# Patient Record
Sex: Male | Born: 1960 | Race: White | Hispanic: No | Marital: Single | Smoking: Former smoker
Health system: Southern US, Community
[De-identification: ages and names within clinical notes are randomized; demographics above are authoritative.]

## PROBLEM LIST (undated history)

## (undated) DIAGNOSIS — I10 Essential (primary) hypertension: Secondary | ICD-10-CM

## (undated) DIAGNOSIS — I509 Heart failure, unspecified: Secondary | ICD-10-CM

## (undated) DIAGNOSIS — I2510 Atherosclerotic heart disease of native coronary artery without angina pectoris: Secondary | ICD-10-CM

## (undated) DIAGNOSIS — I219 Acute myocardial infarction, unspecified: Secondary | ICD-10-CM

---

## 2013-01-29 DIAGNOSIS — R55 Syncope and collapse: Secondary | ICD-10-CM

## 2013-01-29 DIAGNOSIS — I214 Non-ST elevation (NSTEMI) myocardial infarction: Secondary | ICD-10-CM

## 2013-01-29 LAB — DRUG SCREEN, URINE
Barbiturates, Ur Screen: NEGATIVE (ref ?–200)
Benzodiazepine, Ur Scrn: POSITIVE (ref ?–200)
Cannabinoid 50 Ng, Ur ~~LOC~~: POSITIVE (ref ?–50)
Cocaine Metabolite,Ur ~~LOC~~: NEGATIVE (ref ?–300)
MDMA (Ecstasy)Ur Screen: NEGATIVE (ref ?–500)
Methadone, Ur Screen: NEGATIVE (ref ?–300)
Opiate, Ur Screen: POSITIVE (ref ?–300)
Phencyclidine (PCP) Ur S: NEGATIVE (ref ?–25)
Tricyclic, Ur Screen: NEGATIVE (ref ?–1000)

## 2013-01-29 LAB — URINALYSIS, COMPLETE
Bilirubin,UR: NEGATIVE
Nitrite: NEGATIVE
RBC,UR: <1 /HPF (ref 0–5)
Specific Gravity: 1.01 (ref 1.003–1.030)
Squamous Epithelial: NONE SEEN
WBC UR: <1 /HPF (ref 0–5)

## 2013-01-29 LAB — TROPONIN I
Troponin-I: 0.12 ng/mL — ABNORMAL HIGH
Troponin-I: 0.15 ng/mL — ABNORMAL HIGH

## 2013-01-29 LAB — BASIC METABOLIC PANEL
Anion Gap: 6 — ABNORMAL LOW (ref 7–16)
Calcium, Total: 9 mg/dL (ref 8.5–10.1)
Creatinine: 0.92 mg/dL (ref 0.60–1.30)
EGFR (African American): >60
Glucose: 98 mg/dL (ref 65–99)
Potassium: 4.1 mmol/L (ref 3.5–5.1)
Sodium: 140 mmol/L (ref 136–145)

## 2013-01-29 LAB — TSH: Thyroid Stimulating Horm: 0.251 u[IU]/mL — ABNORMAL LOW

## 2013-01-29 LAB — CBC
HCT: 34 % — ABNORMAL LOW (ref 40.0–52.0)
MCH: 31.7 pg (ref 26.0–34.0)
MCHC: 33.7 g/dL (ref 32.0–36.0)
MCV: 94 fL (ref 80–100)
Platelet: 237 10*3/uL (ref 150–440)

## 2013-01-29 LAB — CK TOTAL AND CKMB (NOT AT ARMC): CK, Total: 98 U/L (ref 35–232)

## 2013-01-29 LAB — PHOSPHORUS: Phosphorus: 3.3 mg/dL (ref 2.5–4.9)

## 2013-01-30 ENCOUNTER — Inpatient Hospital Stay: Payer: Self-pay | Admitting: Specialist

## 2013-01-30 DIAGNOSIS — I2510 Atherosclerotic heart disease of native coronary artery without angina pectoris: Secondary | ICD-10-CM

## 2013-01-30 LAB — CBC WITH DIFFERENTIAL/PLATELET
Basophil %: 0.9 %
Eosinophil %: 1.8 %
HCT: 34.3 % — ABNORMAL LOW (ref 40.0–52.0)
HGB: 11.7 g/dL — ABNORMAL LOW (ref 13.0–18.0)
Lymphocyte %: 42.5 %
MCH: 32 pg (ref 26.0–34.0)
MCHC: 34.1 g/dL (ref 32.0–36.0)
Monocyte #: 0.3 x10 3/mm (ref 0.2–1.0)
Neutrophil #: 2.3 10*3/uL (ref 1.4–6.5)
Platelet: 219 10*3/uL (ref 150–440)
RBC: 3.65 10*6/uL — ABNORMAL LOW (ref 4.40–5.90)
WBC: 4.8 10*3/uL (ref 3.8–10.6)

## 2013-01-30 LAB — COMPREHENSIVE METABOLIC PANEL
Albumin: 2.9 g/dL — ABNORMAL LOW (ref 3.4–5.0)
Alkaline Phosphatase: 72 U/L (ref 50–136)
BUN: 14 mg/dL (ref 7–18)
Co2: 25 mmol/L (ref 21–32)
Creatinine: 0.93 mg/dL (ref 0.60–1.30)
EGFR (African American): >60
EGFR (Non-African Amer.): >60
Glucose: 87 mg/dL (ref 65–99)
Osmolality: 279 (ref 275–301)
Potassium: 4.1 mmol/L (ref 3.5–5.1)
SGOT(AST): 23 U/L (ref 15–37)
SGPT (ALT): 19 U/L (ref 12–78)
Sodium: 140 mmol/L (ref 136–145)
Total Protein: 7.3 g/dL (ref 6.4–8.2)

## 2013-01-30 LAB — CK TOTAL AND CKMB (NOT AT ARMC)
CK, Total: 82 U/L (ref 35–232)
CK-MB: 1.1 ng/mL (ref 0.5–3.6)

## 2013-01-30 LAB — MAGNESIUM: Magnesium: 1.8 mg/dL

## 2013-01-30 LAB — OCCULT BLOOD X 1 CARD TO LAB, STOOL: Occult Blood, Feces: NEGATIVE

## 2013-01-30 LAB — TROPONIN I: Troponin-I: 0.16 ng/mL — ABNORMAL HIGH

## 2013-01-31 LAB — URINE CULTURE

## 2013-03-16 ENCOUNTER — Inpatient Hospital Stay: Payer: Self-pay | Admitting: Internal Medicine

## 2013-03-16 LAB — CBC
HCT: 33.7 % — ABNORMAL LOW (ref 40.0–52.0)
HGB: 11.4 g/dL — ABNORMAL LOW (ref 13.0–18.0)
MCH: 32.2 pg (ref 26.0–34.0)
MCV: 95 fL (ref 80–100)

## 2013-03-16 LAB — BASIC METABOLIC PANEL
Anion Gap: 5 — ABNORMAL LOW (ref 7–16)
Chloride: 107 mmol/L (ref 98–107)
Creatinine: 1.01 mg/dL (ref 0.60–1.30)
Osmolality: 282 (ref 275–301)
Potassium: 4 mmol/L (ref 3.5–5.1)
Sodium: 141 mmol/L (ref 136–145)

## 2013-03-16 LAB — URINALYSIS, COMPLETE
Bacteria: NONE SEEN
Blood: NEGATIVE
Ketone: NEGATIVE
Nitrite: NEGATIVE
Ph: 8 (ref 4.5–8.0)
Specific Gravity: 1.008 (ref 1.003–1.030)
Squamous Epithelial: NONE SEEN
WBC UR: 1 /HPF (ref 0–5)

## 2013-03-16 LAB — CK-MB: CK-MB: 1.9 ng/mL (ref 0.5–3.6)

## 2013-03-16 LAB — TROPONIN I: Troponin-I: <0.02 ng/mL

## 2013-03-16 LAB — CK TOTAL AND CKMB (NOT AT ARMC)
CK, Total: 187 U/L (ref 35–232)
CK-MB: 2.5 ng/mL (ref 0.5–3.6)

## 2013-03-16 LAB — DRUG SCREEN, URINE
Barbiturates, Ur Screen: NEGATIVE (ref ?–200)
Methadone, Ur Screen: NEGATIVE (ref ?–300)
Tricyclic, Ur Screen: NEGATIVE (ref ?–1000)

## 2013-03-16 LAB — ETHANOL
Ethanol %: <0.003 % (ref 0.000–0.080)
Ethanol: <3 mg/dL

## 2014-08-17 ENCOUNTER — Inpatient Hospital Stay: Payer: Self-pay | Admitting: Internal Medicine

## 2014-08-17 LAB — COMPREHENSIVE METABOLIC PANEL
ALBUMIN: 3.5 g/dL (ref 3.4–5.0)
ALK PHOS: 50 U/L
AST: 43 U/L — AB (ref 15–37)
Anion Gap: 5 — ABNORMAL LOW (ref 7–16)
BUN: 19 mg/dL — AB (ref 7–18)
Bilirubin,Total: 0.9 mg/dL (ref 0.2–1.0)
CALCIUM: 8.5 mg/dL (ref 8.5–10.1)
CREATININE: 1.06 mg/dL (ref 0.60–1.30)
Chloride: 102 mmol/L (ref 98–107)
Co2: 28 mmol/L (ref 21–32)
EGFR (Non-African Amer.): >60
Glucose: 97 mg/dL (ref 65–99)
Osmolality: 272 (ref 275–301)
Potassium: 4.1 mmol/L (ref 3.5–5.1)
SGPT (ALT): 23 U/L
SODIUM: 135 mmol/L — AB (ref 136–145)
Total Protein: 7.9 g/dL (ref 6.4–8.2)

## 2014-08-17 LAB — CBC
HCT: 36.5 % — ABNORMAL LOW (ref 40.0–52.0)
HGB: 12.1 g/dL — ABNORMAL LOW (ref 13.0–18.0)
MCH: 33.4 pg (ref 26.0–34.0)
MCHC: 33.2 g/dL (ref 32.0–36.0)
MCV: 101 fL — ABNORMAL HIGH (ref 80–100)
PLATELETS: 127 10*3/uL — AB (ref 150–440)
RBC: 3.64 10*6/uL — ABNORMAL LOW (ref 4.40–5.90)
RDW: 13.3 % (ref 11.5–14.5)
WBC: 7.5 10*3/uL (ref 3.8–10.6)

## 2014-08-17 LAB — PROTIME-INR
INR: 1
Prothrombin Time: 13.1 secs (ref 11.5–14.7)

## 2014-08-17 LAB — CK TOTAL AND CKMB (NOT AT ARMC)
CK, Total: 1576 U/L — ABNORMAL HIGH (ref 39–308)
CK-MB: 1.9 ng/mL (ref 0.5–3.6)

## 2014-08-17 LAB — HEPARIN LEVEL (UNFRACTIONATED): ANTI-XA(UNFRACTIONATED): 0.14 [IU]/mL — AB (ref 0.30–0.70)

## 2014-08-17 LAB — APTT: Activated PTT: 36.7 secs — ABNORMAL HIGH (ref 23.6–35.9)

## 2014-08-17 LAB — TROPONIN I
TROPONIN-I: 1 ng/mL — AB
Troponin-I: 1.3 ng/mL — ABNORMAL HIGH
Troponin-I: 0.97 ng/mL — ABNORMAL HIGH

## 2014-08-17 LAB — CK-MB
CK-MB: 1.4 ng/mL (ref 0.5–3.6)
CK-MB: 1.1 ng/mL (ref 0.5–3.6)

## 2014-08-18 LAB — HEPARIN LEVEL (UNFRACTIONATED)
Anti-Xa(Unfractionated): 0.21 IU/mL — ABNORMAL LOW (ref 0.30–0.70)
Anti-Xa(Unfractionated): 0.31 IU/mL (ref 0.30–0.70)

## 2014-08-18 LAB — BASIC METABOLIC PANEL
ANION GAP: 5 — AB (ref 7–16)
BUN: 17 mg/dL (ref 7–18)
CALCIUM: 8.4 mg/dL — AB (ref 8.5–10.1)
CO2: 28 mmol/L (ref 21–32)
Chloride: 104 mmol/L (ref 98–107)
Creatinine: 0.96 mg/dL (ref 0.60–1.30)
GLUCOSE: 88 mg/dL (ref 65–99)
Osmolality: 275 (ref 275–301)
POTASSIUM: 4.1 mmol/L (ref 3.5–5.1)
SODIUM: 137 mmol/L (ref 136–145)

## 2014-08-18 LAB — LIPID PANEL
Cholesterol: 121 mg/dL (ref 0–200)
HDL Cholesterol: 38 mg/dL — ABNORMAL LOW (ref 40–60)
LDL CHOLESTEROL, CALC: 66 mg/dL (ref 0–100)
Triglycerides: 86 mg/dL (ref 0–200)
VLDL Cholesterol, Calc: 17 mg/dL (ref 5–40)

## 2014-08-18 LAB — CBC WITH DIFFERENTIAL/PLATELET
Basophil #: 0.1 10*3/uL (ref 0.0–0.1)
Basophil %: 1 %
Eosinophil #: 0 10*3/uL (ref 0.0–0.7)
Eosinophil %: 0.7 %
HCT: 34.2 % — AB (ref 40.0–52.0)
HGB: 11.3 g/dL — AB (ref 13.0–18.0)
Lymphocyte #: 1.8 10*3/uL (ref 1.0–3.6)
Lymphocyte %: 26.7 %
MCH: 33.2 pg (ref 26.0–34.0)
MCHC: 33.1 g/dL (ref 32.0–36.0)
MCV: 101 fL — ABNORMAL HIGH (ref 80–100)
MONO ABS: 0.9 x10 3/mm (ref 0.2–1.0)
MONOS PCT: 13.5 %
NEUTROS ABS: 3.8 10*3/uL (ref 1.4–6.5)
Neutrophil %: 58.1 %
Platelet: 131 10*3/uL — ABNORMAL LOW (ref 150–440)
RBC: 3.4 10*6/uL — ABNORMAL LOW (ref 4.40–5.90)
RDW: 13.1 % (ref 11.5–14.5)
WBC: 6.6 10*3/uL (ref 3.8–10.6)

## 2014-12-13 NOTE — H&P (Signed)
PATIENT NAME:  Ethan White, Ethan White MR#:  [id number] DATE OF BIRTH:  1961-06-21  DATE OF ADMISSION:  03/16/2013  PRIMARY CARE PHYSICIAN:  Nonlocal.   REFERRING PHYSICIAN:  Dr. Dolores FrameSung.   CARDIOLOGY:  Dr. Mariah MillingGollan.   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 Windell HummingbirdGollan's group is presenting with chest pain.  He is reporting that his chest pain started last night at around 12:15 a.m.  It was stabbing in nature, started in the right shoulder area radiating to the left shoulder across the anterior chest wall and stabbing in nature.  Not associated with nausea, vomiting, shortness of breath or diaphoresis.  As the pain is getting worse, the patient called EMS who gave him 4 of baby aspirin with no significant improvement.  Nitroglycerin was given which did not help him either.  Eventually the patient has received morphine for the chest pain which took the edge off.  The morphine is helping with the chest pain.  The patient thinks Dilaudid will be a better choice as he feels much better with Dilaudid.  The patient was admitted to the hospital in June 2014 with a similar complaint of chest pain with the medical record number [id number].  At that time the patient was evaluated by cardiologist, Dr. Mariah MillingGollan.  The patient had a cardiac cath done which has revealed in-stent restenosis estimated at 50%, but not amenable to acute cardiac intervention and medical management was recommended.  The patient actually lives in [place], but comes to [place] to help his uncle on and off.  During my examination, the patient is complaining of chest pain and morphine 4 mg IV was given to take the edge off.  EKG has revealed no acute changes and first set of troponin are negative.  The patient has  received Lovenox 1 mg/kg subQ x 1 and a urine drug screen is positive for benzodiazepines and cannabinoids.  I have called on-call cardiology, Dr. Graciela HusbandsKlein regarding the patient's history and discussed with him.  As patient's history is complicated, Dr. Graciela HusbandsKlein said they would either recommend flow function reserve or a stress test after seeing the patient in the a.m.    PAST MEDICAL HISTORY:  Coronary artery disease status post cardiac cath in 01/30/2013, hypertension, history of MI in 2006, hyperlipidemia, history of kidney stones.   PAST SURGICAL HISTORY:  Three coronary artery stents in the year 2006, one stent in 2009 after a stress test.  Finger reconstruction on the right upper extremity and multiple cardiac caths, neck surgery fusion of C5 and C6.    ALLERGIES:  The patient is allergic to [facility] AND TRAMADOL.   PSYCHOSOCIAL HISTORY:  Actually lives in [place], but currently residing with girlfriend to help his uncle with his work.  Smokes half pack a day.  Denies any alcohol.  Denies any illicit drugs either.  Smoking cessation was provided to the patient, but at this point the patient will think about it.   FAMILY HISTORY:  Negative for coronary artery disease.  Brain cancer in his paternal grandpa.   HOME MEDICATIONS:  Lovastatin 20 mg once daily, lorazepam 3 times a day, enalapril 20 mg once daily,  aspirin 81 mg once daily.   REVIEW OF SYSTEMS: CONSTITUTIONAL:  Denies any fever, fatigue.  EYES:  Denies any blurry vision, glaucoma.  EARS, NOSE, THROAT:  Denies any epistaxis, discharge.  RESPIRATION:  Denies cough, COPD.  CARDIOVASCULAR:  Complaining of chest pain.  Denies syncope.  GASTROINTESTINAL:  Denies nausea, vomiting, diarrhea.  GENITOURINARY:  No dysuria, hematuria or hernia.  ENDOCRINE:  No polyuria, nocturia, thyroid problems.  HEMATOLOGIC AND LYMPHATIC:  Denies anemia, easy bruising.  INTEGUMENTARY:  No acne, rash, lesions.  MUSCULOSKELETAL:  No joint pain in the neck  or back.  Complaining of chest pain.  NEUROLOGIC:  Denies any vertigo or ataxia.  PSYCHIATRIC:  Denies ADD, OCD.   PHYSICAL EXAMINATION: VITAL SIGNS:  Pulse 71, respirations 16 to 18, temperature 98.1, blood pressure 120/82, pulse ox 98% on 2 liters.  GENERAL APPEARANCE:  Not under acute distress.  Moderately built and moderately nourished.  HEENT:  Normocephalic, atraumatic.  Pupils are equally reactive to light and accommodation.  No scleral icterus.  No conjunctival injection.  No sinus tenderness.  No postnasal drip.  NECK:  Supple.  No JVD.  No thyromegaly.  No lymphadenopathy.  Range of motion is intact.  LUNGS:  Clear to auscultation bilaterally.  No accessory muscle usage.  No anterior chest wall tenderness on palpation.  CARDIAC:  S1, S2 normal.  Regular rate and rhythm.  No murmurs.  GASTROINTESTINAL:  Soft.  Bowel sounds are positive in all four quadrants.  Nontender, nondistended.  No masses felt.  No hepatosplenomegaly.  NEUROLOGIC:  Awake, alert, oriented x 3.  Cranial nerves II through XII are grossly intact.  Motor and sensory are intact.  Reflexes are 2+.  EXTREMITIES:  No edema.  No cyanosis.  No clubbing.  SKIN:  Normal turgor.  No rashes.  No lesions.  Multiple tattoos are noticed on the upper body.  MUSCULOSKELETAL:  No joint effusion, tenderness or erythema.   LABORATORY AND IMAGING STUDIES:  CK total 187, CPK-MB 2.5, troponin less than 0.02.  BMP is normal except anion gap which is low at 5.  Urine drug screen is positive for cannabinoids and benzodiazepines.  WBC 6.6, hemoglobin 11.4, hematocrit 33.7, platelets 195.  Urinalysis yellow in color, clear in appearance, glucose, bilirubin, ketones are negative, leukocyte esterase and nitrite is negative.  Chest x-ray, no acute findings.   ASSESSMENT AND PLAN:  A 54 year old male coming into the ER with a history of stabbing chest pain with a recent history of cardiac cath on June 10th which has revealed in-stent restenosis 50%,  not amenable to acute cardiac interventions and recommended medical management by Dr. Windell Hummingbird group, will be admitted to telemetry.  1.  Unstable angina.  We will admit him to telemetry.  We will keep him nothing by mouth and provide IV fluids.  2.  Lovenox 1 mg/kg subQ x 1 was given in the ER.  We will continue subQ q. 12 hours until acute coronary syndrome is ruled out.  The patient has received aspirin and nitro paste was attached to the anterior chest wall.  We will provide him morphine as needed basis.  Cycle cardiac biomarkers.  Continue acute coronary syndrome protocol with atenolol and statin.  Telemetry.  A cardiology consult is placed and discussed with Dr. Graciela Husbands on-call cardiologist regarding this complicated case.  Dr. Graciela Husbands is recommending either stress test or flow function reserve estimate after seeing the patient this morning.   3.  Urine drug screen is positive for cannabinoids, but the patient denies any.  4.  History of coronary artery disease status post stent.  We will continue  acute coronary syndrome protocol.  5.  Hypertension.  Continue atenolol and enalapril.  6.  Hyperlipidemia.  Continue statin.  7.  Chronic pain syndrome.  The patient will be receiving morphine as needed for chest pain and other pain.  8.  We will provide him GI prophylaxis.  9.  DVT prophylaxis.  The patient is on Lovenox 1 mg/kg subQ q. 12 hours.  10.  He is FULL CODE.  Medical power of attorney is his mom.  Diagnosis and plan of care was discussed in detail with the patient.  He is aware of the plan.   Total time spent on the admission is 50 minutes.     ____________________________ Ramonita Lab, MD ag:ea D: 03/16/2013 06:23:00 ET T: 03/16/2013 06:52:55 ET JOB#: [id number]  cc: Ramonita Lab, MD, <Dictator> Antonieta Iba, MD Ramonita Lab MD ELECTRONICALLY SIGNED 03/19/2013 7:14

## 2014-12-13 NOTE — Discharge Summary (Signed)
PATIENT NAME:  Ethan White, Ethan White MR#:  [id number] DATE OF BIRTH:  1961/05/29  DATE OF ADMISSION:  03/16/2013 DATE OF DISCHARGE:  03/16/2013  PRIMARY CARE PHYSICIAN: None local.  The patient left the hospital without informing our staff.   DIAGNOSES:  1.  Unstable angina. 2.  Possible substance abuse.  3.  Coronary artery disease.  4.  Hypertension.  5.  Hyperlipidemia.  6.  Chronic pain syndrome.   REASON FOR ADMISSION: Chest pain.   HOSPITAL COURSE: The patient is a 54 year old male with a history of CAD. He had recent cardiac catheter on 06/10 of this year, which showed in-stent restenosis of 50%. Recommended medical management by Dr. Mariah MillingGollan. The patient comes to the ED for chest pain, with radiation to the left shoulder. For detailed history and physical examination, please refer to the admission note dictated by Dr. Amado CoeGouru. The patient was admitted for stable angina. The patient has been treated with Lovenox 1 mg/kg subcutaneous q.12 hours. The patient's chest pain has improved after admission, but he complains of bilateral shoulder pain requesting pain medication; however, the patient's troponin levels were negative for 2 sets. The patient the left the hospital without informing anybody.  ____________________________ Shaune PollackQing Eladia Frame, MD qc:aw D: 03/16/2013 13:44:17 ET T: 03/16/2013 16:15:14 ET JOB#: [id number]  cc: Shaune PollackQing Avary Pitsenbarger, MD, <Dictator> Shaune PollackQING Zaelyn Noack MD ELECTRONICALLY SIGNED 03/20/2013 11:09

## 2014-12-13 NOTE — Discharge Summary (Signed)
PATIENT NAME:  Ethan White, Ethan White MR#:  [id number] DATE OF BIRTH:  12-23-1960  DATE OF ADMISSION:  01/29/2013 DATE OF DISCHARGE:  01/30/2013  HISTORY AND PHYSICAL: For a detailed note, please take a look at the history and physical done on admission by Dr. Mordecai MaesSanchez.   DISCHARGE DIAGNOSES:  1.  Chest pain, likely musculoskeletal in nature.  2.  History of coronary artery disease.  3.  Elevated troponin likely in the setting of demand ischemia.  4.  Chronic pain syndrome.  5.  Hypertension.  6.  Hyperlipidemia.  7.  Tobacco abuse.   DIET: The patient is being discharged on a low sodium, low fat diet.   ACTIVITY: As tolerated.   FOLLOWUP:  In the next 1 to 2 weeks with [facility] in [facility].   DISCHARGE MEDICATIONS: Aspirin 81 mg daily, enalapril 20 mg daily, atenolol 50 mg daily, lovastatin 20 mg daily, Tylenol/oxycodone 300/7.5, 1 to 2 tabs every 6 hours as needed for pain.   CONSULTANTS DURING THE HOSPITAL COURSE: Dr. Julien Nordmannimothy Gollan from cardiology.   PERTINENT STUDIES DONE DURING THE HOSPITAL COURSE:  A cardiac catheterization done on 01/30/2013 showing moderate left anterior disease with in-stent restenosis estimated at 50%, moderate ostial diagonal disease, severe diffuse disease of the small mid to distal circumflex, EF 40%, mild global hypokinesis. No significant aortic stenosis or mitral regurgitation. Medical management is recommended.   HOSPITAL COURSE: This is a 54 year old male with medical problems as mentioned above, presented to the hospital with chest pain/tightness and a mildly elevated troponin.  1.  Chest pain:  Most likely cause of the patient's chest pain is musculoskeletal in nature. He did have a mild troponin elevation; therefore, there was some concern for a non ST-elevation MI. Therefore, the patient was started on full-dose Lovenox, maintained on aspirin, beta blocker, and a statin. Cardiology consult was obtained. The patient underwent a cardiac  catheterization which did show some diffuse disease, but not anything that was amenable to any acute cardiac intervention, and medical management was recommended. The patient was apparently chest pain-free and hemodynamically stable and, therefore, discharged home.  The likely cause of the elevated troponin was probably some mild demand ischemia given his diffuse coronary artery disease.  2.  Hypertension: The patient remained hemodynamically stable on his atenolol and his enalapril. He is being discharged on that.  3.  Hyperlipidemia: He was maintained on some lovastatin. He is being discharged on that.  4.  Tobacco abuse: The patient was strongly advised to quit smoking. He was maintained on a nicotine patch while in the hospital.  5.  Chronic pain syndrome:  I did discharge him on some low-dose Norco. He is planning on getting a referral to a pain management clinic as an outpatient, which would benefit him.   CODE STATUS: The patient is a FULL CODE.   TIME SPENT: 35 minutes.   ____________________________ Rolly PancakeVivek J. Cherlynn KaiserSainani, MD vjs:cb D: 01/30/2013 16:27:06 ET T: 01/30/2013 16:50:07 ET JOB#: [id number]  cc: Rolly PancakeVivek J. Cherlynn KaiserSainani, MD, <Dictator> Houston SirenVIVEK J Yunuen Mordan MD ELECTRONICALLY SIGNED 02/09/2013 20:36

## 2014-12-13 NOTE — H&P (Signed)
PATIENT NAME:  Ethan White, Ethan White MR#:  [id number] DATE OF BIRTH:  [birth year]  DATE OF ADMISSION:  01/29/2013  CHIEF COMPLAINT: Syncope and chest pain.   PRIMARY CARE PHYSICIAN: None.  HISTORY OF PRESENT ILLNESS: Mr. Ethan White is a very nice 54 year old gentleman with history of hypertension, previous MI with multiple stents. He also has severe neck pain and status post surgery on the neck due to herniated disk. Apparently, the patient was sitting on a bench this morning and suddenly had onset of chest pain and syncopal episode at the same time. Apparently the patient collapsed for several minutes, woke up. He was a little bit confused and continued to have chest pain. His mom called EMS. EMS picked him up. At that moment, he was complaining of neck pain, back pain just after the syncope and the fall. During this episode, the patient denied any nausea, vomiting or sweating. This patient started developing chest pain that started in the right shoulder, radiating across the chest into the left shoulder, pressure-like, with radiation to the left arm which feels like burning and tingling. The patient currently has tingling of the upper extremity. The patient also stated that he has had weight loss of over 30 pounds within a month. He is a smoker. He denies any difficulty swallowing, abdominal pain, blood in the stool or melena. The patient is admitted for evaluation of all these problems. The syncopal episode happened around 11:30. His troponin is slightly elevated. His EKG showed no significant changes and his x-rays are mostly normal.   REVIEW OF SYSTEMS: A 12-system review of systems is done, all negative except for what is mentioned in HPI.  CONSTITUTIONAL: No fever. No fatigue. No weakness. Positive 30-pound weight loss.  EYES: No blurry vision, double vision, glaucoma.  EARS, NOSE, THROAT: No tinnitus, difficulty swallowing, postnasal drip or sinus pain.  RESPIRATORY: No significant cough. No significant  wheezing. The patient has a long history of smoking. He denies any COPD. He does not take medications for that.  GASTROINTESTINAL: No nausea, vomiting, abdominal pain, hematemesis, melena, jaundice or GERD.  CARDIOVASCULAR: Positive history of chest pain today. No orthopnea, no edema. No previous arrhythmias. Positive history of 4 stents. Positive history of previous MI. No palpitations. Syncope today is positive, but no previous.  GENITOURINARY: No dysuria, hematuria, changes in frequency. No prostatitis.  ENDOCRINE: No polyuria, polydipsia, polyphagia, cold or heat intolerance.  HEMATOLOGIC AND LYMPHATIC: No anemia, easy bruising or swollen glands.  SKIN: No rashes or new lesions.  MUSCULOSKELETAL: Positive neck pain, which is chronic. Occasional back pain. No gout.  NEUROLOGIC: No numbness, weakness. No TIAs. No CVAs. No vertigo or headaches.  PSYCHIATRIC: No significant anxiety, depression or insomnia.   PAST MEDICAL HISTORY: 1.  Coronary artery disease.  2.  Hypertension.  3.  History of MI in 2006.  4.  Hyperlipidemia.  5.  History of kidney stones.   PAST SURGICAL HISTORY: 1.  Three coronary artery stents in 2006, 1 stent in 2009 after a stress test.  2.  Finger reconstruction on the right upper extremity.  3.  Neck surgery, fusion of C5 and C6.  4.  Multiple cardiac catheterizations.   FAMILY HISTORY: Negative for coronary artery disease. Negative for diabetes or hypertension. Positive for brain cancer in his paternal grandfather.   SOCIAL HISTORY:  1.  The patient smokes 1/2 pack a day. He has been smoking for 30 years and previous to that, he used to smoke up to 1 pack a day.  He is wanting to quit, but he feels like he is not quite ready yet. Smoking cessation education has been given to the patient for over 3 minutes. Offered help. He states that he is not quite ready yet.  2.  Alcohol: He quit drinking several years ago.  3.  IV drug abuse: No use of  IV drugs.  4.  The  patient lives with his mother. He is unemployed, looking for disability.   PHYSICAL EXAMINATION: VITAL SIGNS: Blood pressure 103/68, pulse 88, respiratory rate 18, afebrile, 94% on room air.  GENERAL: Alert, oriented x 3. No acute distress. No respiratory distress. Hemodynamically stable.  HEENT: Pupils equal and reactive. Extraocular movements are intact. Mucosa are moist. Anicteric sclerae. Pink conjunctivae. No oral lesions. No oropharyngeal exudates. Slightly dry mucosa. No masses.  NECK: Supple. No JVD. No thyromegaly. No adenopathy. Bruit on the right side of neck. The patient is in a C-collar at this moment, but he is starting to feel a little bit better. No major tenderness to palpation of posterior spine. No deformity.  CARDIOVASCULAR: Regular rate and rhythm. No murmurs, rubs or gallops. No displacement of PMI. Mild tenderness to palpation of anterior chest wall.  LUNGS: Mostly clear, with some crackling at the level of left base. No dullness to percussion. No use of accessory muscles. No wheezing or crepitus.  ABDOMEN: Soft, excavatum abdomen. No tenderness to palpation. No masses are palpated at this moment. Bowel sounds are positive. He is wearing pants that look about 3 or 4 sizes bigger than he is, and he states that they used to fit perfectly before.  GENITAL: Deferred.  EXTREMITIES: No edema, cyanosis or clubbing. Pulses +2. Capillary refill less than 3. LYMPHATIC: Negative for lymphadenopathy in neck or supraclavicular areas.  SKIN: No rashes, petechiae. Decreased turgor.  NEUROLOGIC: Cranial nerves II through XII intact.  His strength is 5/5 in all 4 extremities. Sensation is normal in 4 extremities. DTRs +2. PSYCHIATRIC: No significant impairment in judgment. The patient is alert, oriented x 3. No agitation.  MUSCULOSKELETAL: No significant joint deformities or joint effusions.   LABORATORY, DIAGNOSTIC AND RADIOLOGICAL DATA: Glucose 98, creatinine 0.92, potassium 4.1, chloride  109, other electrolytes within normal limits. Troponin 0.12. White count is 4.4, hemoglobin 11.5, platelet count 237. X-rays: Chest x-ray without any major abnormalities. I see several nodularities, mostly in the right lung. Possible emphysematous changes as well. CT of cervical spine: No acute osseous injury. No ligamentous injury noted. Lumbar spine seems to be normal. Cervical spine x-ray: There is loss of height and body of C3. CT of the head: No acute abnormalities. EKG: No ST depression or elevation, normal sinus rhythm, J-point elevation about 1 mm in V3 and V4.   ASSESSMENT AND PLAN: This is a very nice 54 year old gentleman with history of hypertension, previous myocardial infarction, hyperlipidemia. He is here for syncopal episode and chest pain.  1.  Syncope: At this moment with his history of significant weight loss, possible electrolyte abnormalities to account for syncope, possible acute coronary syndrome, possible peripheral artery disease with carotid stenosis. The patient actually has a small bruit on the right side of his neck. The patient also has significant history of coronary artery disease, for which ventricular arrhythmias are high on the differential. For all of this, we are going to put him on telemetry monitoring, monitor closely. As he has elevated troponin, we are going to put him on Lovenox and continue treatment of acute coronary syndrome with aspirin, beta blocker  and statin. Add nitroglycerin, as the patient still has chest pain.  2.  Chest pain/acute coronary syndrome: The patient has positive troponin x 1. We are going to correlate with serial biomarkers. The patient has a myocardial infarction previously and he has 4 stents. We are going to ask for cardiology consultation to evaluate for this. I do not think that he is going to be a good candidate for stress testing at this moment due to his elevation of troponin. We are going to order an echocardiogram in the morning and see  what cardiology has to say about it. Lovenox 1 mg/kg twice daily, atenolol 25 mg, aspirin 325 mg, pravastatin 20 mg. The patient is taking Dilaudid for pain. We will continue that.  3.  Hyperlipidemia: Continue his pravastatin.  4.  Hypertension: Continue atenolol.  5. Smoking: Smoking cessation given to the patient. The patient is not interested in quitting. Nicotine patch offered. The  patient said that he will think about it.  6.  Coronary artery disease: Continue beta blocker, statin and aspirin.  7.  Deep vein thrombosis prophylaxis: On Lovenox.  8.  Weight loss of 30 pounds: The patient has high probabilities to have gastrointestinal cancer versus lung cancer most likely. We are going to get a urinalysis to evaluate for bladder cancer, blood in the stool to evaluate for gastrointestinal cancer, and CT of the chest to evaluate for lung cancer, as his weight loss was 30 pounds within a month.  9.  Gastrointestinal prophylaxis with Protonix, as the patient is going to be on Lovenox and aspirin.   TIME SPENT: I spent about 45 minutes with this patient.    ____________________________ Felipa Furnace, MD rsg:jm D: 01/29/2013 14:24:53 ET T: 01/29/2013 15:13:21 ET JOB#: [id number]  cc: Felipa Furnace, MD, <Dictator>  Ethan White Juanda Chance MD ELECTRONICALLY SIGNED 02/06/2013 12:33

## 2014-12-13 NOTE — Consult Note (Signed)
General Aspect Ethan White is a 54yo gentleman with PMHx s/f CAD (s/p unspecified stenting x 4, most recently 09/2006), HTN, dyslipidemia, ongoing tobacco abuse and chronic neck pain who was admitted to [facility] today for chest pain and syncope.  He has undergone prior PCI in 04/06/2005 in the setting of MI- received 3 stents at that time. He underwent PTCA/stent placement again in 09/2006 after an abnormal stress test. Care received at [facility]. He has not had an ischemic evaluation since. He is in town visiting relatives.   Present Illness He was in his USOH until 2-3 days ago. He reports becoming lightheaded frequently on standing. This morning, he was sitting at a picnic table at his relative's house when he experienced sudden anterior/upper chest pressure rated at a 9/10 radiating to his left jaw and arm with associated nausea, diaphoresis reminiscent of his prior MI. No other associated symptoms. He then lost consciousness suddenly for 10-15 seconds. He reports landing face-first. This was witnessed by his mother. He did not bite his tongue, lose bowel/bladder function or jerk involuntary. No postictal state. The chest pain had resolved on awaking. He was transported to the ED via EMS.  He received a full-dose ASA and NTG SL with some improvement. The chest discomfort has recurred intermittently for several seconds at at time. Initial trop-I elevated at 0.12. BMET unremarkable. TSH low at 0.251. CT head/cervical spine w/o acute abnormality. CT chest indicates atherosclerosis of LAD, LCx, otherwise nonacute. UDS is positive for opiates, benzos and cannabis. He did receive morphine, dilaudid in the ED. No opiates or benzos mentioned on home meds. On interview, pt constantly requests pain meds.   PAST MEDICAL HISTORY: CAD, HTN, dyslipidemia, tobacco abuse, chronic neck pain  PAST SURGICAL HISTORY: Various surgeries on his fingers, prior vertebral fusion. PCI x 3 in 2006, x 1 in 2008, respectively.   SOCIAL  HISTORY: 15 pack-year history tobacco abuse (1/2 PPD). Denies EtOH or illicit drugs.  FAMILY HISTORY: Denies cardiac history in 1st degree relatives. No HTN, dyslipidemia, DM2 or cancer in the family.  ALLERGIES: Tordal, NSAIDs, Ultram (hives per patient)  MEDICATIONS: Lovastatin 57m PO daily, Enalapril 231mPO daily, ASA 8120mO daily, atenolol 59m24mily (per patient)   Physical Exam:  GEN thin   HEENT PERRL, hearing intact to voice   NECK supple  No masses  trachea midline   RESP normal resp effort  clear BS  no use of accessory muscles  distant breath sounds   CARD Regular rate and rhythm  Normal, S1, S2  No murmur   ABD denies tenderness  soft  normal BS   EXTR negative cyanosis/clubbing, negative edema   SKIN normal to palpation   NEURO follows commands, motor/sensory function intact   PSYCH alert, A+O to time, place, person   Review of Systems:  Subjective/Chief Complaint chest pain, syncope   General: 25-30 lbs unintentional weight loss x 2-3 months; denies fevers or chills   Neck: Pain  Stiffness   Respiratory: No Complaints   Cardiovascular: Chest pain or discomfort  Tightness   Gastrointestinal: Nausea   Musculoskeletal: Muscle or joint pain   Neurologic: Dizzness  syncope   Hematologic: Denies active bleeding   Lab Results:  Thyroid:  09-Jun-14 11:33   Thyroid Stimulating Hormone  0.251 (0.45-4.50 (International Unit)  ----------------------- Pregnant patients have  different reference  ranges for TSH:  - - - - - - - - - -  Pregnant, first trimetser:  0.36 - 2.50 uIU/mL)  LabObservation:  09-Jun-14 14:39   OBSERVATION Reason for Test  Routine Chem:  09-Jun-14 11:33   Magnesium, Serum 1.9 (1.8-2.4 THERAPEUTIC RANGE: 4-7 mg/dL TOXIC: > 10 mg/dL  -----------------------)  Phosphorus, Serum 3.3 (Result(s) reported on 29 Jan 2013 at 03:30PM.)  Glucose, Serum 98  BUN 15  Creatinine (comp) 0.92  Sodium, Serum 140  Potassium, Serum 4.1   Chloride, Serum  109  CO2, Serum 25  Calcium (Total), Serum 9.0  Anion Gap  6  Osmolality (calc) 280  eGFR (African American) >60  eGFR (Non-African American) >60 (eGFR values <70m/min/1.73 m2 may be an indication of chronic kidney disease (CKD). Calculated eGFR is useful in patients with stable renal function. The eGFR calculation will not be reliable in acutely ill patients when serum creatinine is changing rapidly. It is not useful in  patients on dialysis. The eGFR calculation may not be applicable to patients at the low and high extremes of body sizes, pregnant women, and vegetarians.)  Result Comment TROPONIN - RESULTS VERIFIED BY REPEAT TESTING.  - C/NATASHA [id number]  - READ-BACK PROCESS PERFORMED.  Result(s) reported on 29 Jan 2013 at 12:31PM.  Urine Drugs:  [id number]  Tricyclic Antidepressant, Ur Qual (comp) NEGATIVE (Result(s) reported on 29 Jan 2013 at 03:27PM.)  Amphetamines, Urine Qual. NEGATIVE  MDMA, Urine Qual. NEGATIVE  Cocaine Metabolite, Urine Qual. NEGATIVE  Opiate, Urine qual POSITIVE  Phencyclidine, Urine Qual. NEGATIVE  Cannabinoid, Urine Qual. POSITIVE  Barbiturates, Urine Qual. NEGATIVE  Benzodiazepine, Urine Qual. POSITIVE (----------------- The URINE DRUG SCREEN provides only a preliminary, unconfirmed analytical test result and should not be used for non-medical  purposes.  Clinical consideration and professional judgment should be  applied to any positive drug screen result due to possible interfering substances.  A more specific alternate chemical method must be used in order to obtain a confirmed analytical result.  Gas chromatography/mass spectrometry (GC/MS) is the preferred confirmatory method.)  Methadone, Urine Qual. NEGATIVE  Cardiac:  09-Jun-14 11:33   Troponin I  0.12 (0.00-0.05 0.05 ng/mL or less: NEGATIVE  Repeat testing in 3-6 hrs  if clinically indicated. >0.05 ng/mL: POTENTIAL  MYOCARDIAL INJURY.  Repeat  testing in 3-6 hrs if  clinically indicated. NOTE: An increase or decrease  of 30% or more on serial  testing suggests a  clinically important change)  Routine UA:  09-Jun-14 15:07   Color (UA) Yellow  Clarity (UA) Clear  Glucose (UA) Negative  Bilirubin (UA) Negative  Ketones (UA) Negative  Specific Gravity (UA) 1.010  Blood (UA) 1+  pH (UA) 6.0  Protein (UA) Negative  Nitrite (UA) Negative  Leukocyte Esterase (UA) Negative (Result(s) reported on 29 Jan 2013 at 03:34PM.)  RBC (UA) <1 /HPF  WBC (UA) <1 /HPF  Bacteria (UA) NONE SEEN  Epithelial Cells (UA) NONE SEEN  Mucous (UA) PRESENT (Result(s) reported on 29 Jan 2013 at 03:34PM.)  Routine Hem:  09-Jun-14 11:33   WBC (CBC) 4.4  RBC (CBC)  3.62  Hemoglobin (CBC)  11.5  Hematocrit (CBC)  34.0  Platelet Count (CBC) 237 (Result(s) reported on 29 Jan 2013 at 11:48AM.)  MCV 94  MCH 31.7  MCHC 33.7  RDW 14.3   EKG:  Interpretation NSR, deep Q waves V1, V2, small Q aVL, nonspecific ST changes V2, V3   Rate 84   EKG Comparision no prior for comparison    Impression 89yogentleman with PMHx s/f CAD (s/p unspecified stenting x 4, most recently 09/2006), HTN, dyslipidemia, ongoing tobacco abuse and chronic  neck pain who was admitted to [facility] today for chest pain and syncope.  1. NSTEMI/CAD The patient's HPI is consistent with ACS. He reports sudden onset severe chest anterior/upper chest pressure radiating to his left jaw and arm with nausea, diaphoresis and syncope reminiscent of his prior MI and partially relived with NTG. Initial trop-I mildly elevated. EKG with changes c/w prior MI, however no acute ischemic changes. Chest CT does indicate LAD, LCx disease.  -- Trend cardiac biomarkers -- Heparinize -- Plan diagnostic cardiac catheterization tomorrow (even if TnI plateaus, HEART score is at least 7 warranting early invasive strategy) -- Continue outpatient meds in ASA, ACEi, BB, statin -- NTG gtt, SL and morphine  IV PRN for chest pain  2. Syncope Concerning for suddenly reduced CO given the acute nature of the event and lack of anticipation prior to syncope (fell on face). He does deny any prior palpitations, but did note presyncopal chest pain. Ischemia-mediated arrhythmia is certainly high on the DDx. Question is if the arrhythmia potentiated the chest discomfort and subsequent syncope. Orthostatic hypotension on DDx as well. Of note, TSH low and pt notes unintentional weight loss as well. 2D echo has been performed and is pending interpretation.  -- Review 2D echo -- Monitor rhythm on telemetry -- Obtain orthostatics -- Hydrate overnight   3. Hypertension BP actually labile on admission. This may be c/w an orthostatic picture or secondary to ACS. BPs have since trended to WNL.  -- Continue ACEi, BB  4. Dyslipidemia -- Obtain lipid panel -- Continue Lovastatin, may benefit from higher potency agent per new ACC guidelines  5. Ongoing tobacco abuse 15 pack-year history. --Offer NRT as a means of tobacco cessation   Electronic Signatures for Addendum Section:  Kathlyn Sacramento (MD) (Signed Addendum 09-Jun-14 17:44)  The patient was seen and examined. Agree with above. Previous MI and multiple stents. Presented with syncope and small NSTEMI. Echo showed an EF 30-35% with severe anteroseptal hypokinesis. Plan: cath tomorrow with LV gram. Might need evaluation for ICD given syncope and low EF. Change Atenolol to Carvedilol.   Electronic Signatures: Meriel Pica (PA-C)  (Signed 09-Jun-14 17:05)  Authored: General Aspect/Present Illness, History and Physical Exam, Review of System, Past Medical History, Home Medications, Labs, EKG , Allergies, Impression/Plan Kathlyn Sacramento (MD)  (Signed 09-Jun-14 17:44)  Co-Signer: General Aspect/Present Illness, History and Physical Exam, Review of System, Past Medical History, Home Medications, Labs, EKG , Allergies, Impression/Plan   Last Updated: 09-Jun-14  17:44 by Kathlyn Sacramento (MD)

## 2014-12-14 NOTE — Consult Note (Signed)
PATIENT NAME:  Ethan White, [name] MR#:  [id number] DATE OF BIRTH:  22-Dec-1960  DATE OF CONSULTATION:  08/18/2014  CONSULTING PHYSICIAN:  Dr. Luberta MutterKonidena  REASON FOR CONSULTATION: Chest pain with non-ST elevation myocardial infarction.   CHIEF COMPLAINT: " I had chest pain."  HISTORY OF PRESENT ILLNESS: A 54 year old male with known cardiovascular disease status post previous myocardial infarction and stenting who has had new onset of substernal chest discomfort waxing and waning over the last 12 to 24 hours. With this he has had no evidence of significant EKG changes, but has had an elevation of troponin of 1.3 consistent with non-ST elevation myocardial infarction. The patient has had resolution of his chest pain at this time, on appropriate medication management including heparin, nitroglycerin, oxygen and beta blocker. The patient has had some back pain which has been treated in the past with narcotics, which is stable.   REVIEW OF SYSTEMS: The remainder review of systems negative for vision change, ringing in the ears, or hearing loss, cough, congestion, heartburn, nausea vomiting, diarrhea or bloody stools, stomach pain, extremity pain, leg weakness, cramping of the buttocks, known blood clots, headaches blackouts, dizzy spells, nosebleeds, congestion, trouble swallowing, frequent urination, urination at night, muscle weakness, numbness, anxiety, depression, skin lesions or skin rashes.   PAST MEDICAL HISTORY:  1.  Hypertension, essential.  2.  Coronary artery disease.   FAMILY HISTORY: Multiple family members with early onset of cardiovascular disease and hypertension.   SOCIAL HISTORY: He smokes a pack a day and some alcohol.   ALLERGIES:  He has had no known drug allergies.   CURRENT MEDICATIONS: As listed.   PHYSICAL EXAMINATION:  VITAL SIGNS: Blood pressure is 110/68 bilaterally. Heart rate 72 upright, reclining, and regular.   GENERAL: He is a well appearing male in no acute  distress.   HEENT: No icterus, thyromegaly, ulcers, hemorrhage, or xanthelasma.   CARDIOVASCULAR: Regular rate and rhythm. Normal S1 and S2 without murmur, gallop, or rub. PMI is normal size and placement. Carotid upstroke normal without bruit. Jugular venous pressure is normal.   LUNGS: Clear to auscultation with normal respirations.   ABDOMEN: Soft, nontender, without hepatosplenomegaly or masses. Abdominal aorta is normal size without bruit.   EXTREMITIES:  Show a 2+ radial, femoral, dorsal pedal pulses, with no lower extremity edema, cyanosis, clubbing or ulcers.   NEUROLOGIC: He is oriented to time, place, and person, with normal mood and affect.   ASSESSMENT: A 54 year old male with known coronary artery disease status post previous stenting with non-ST elevation myocardial infarction, mixed hyperlipidemia and tobacco abuse.   RECOMMENDATIONS:  1.  The patient was counseled on the discontinuation of tobacco abuse.  2.  Consider echocardiogram for left ventricular systolic dysfunction with myocardial infarction.  3.  Proceed to cardiac catheterization to assess coronary anatomy and further treatment thereof as necessary. The patient understands the risks and benefits of cardiac catheterization. These include the possibility of death, stroke, heart attack, infection, bleeding or blood clot. He is at low risk for conscious sedation.  4.  Heparin for further risk reduction in cardiovascular event.  5.  Beta blocker for further treatment of myocardial infarction.     ____________________________ Lamar BlinksBruce J. Shyla Gayheart, MD bjk:at D: 08/18/2014 08:06:11 ET T: 08/18/2014 10:08:30 ET JOB#: [id number]  cc: Lamar BlinksBruce J. Bettymae Yott, MD, <Dictator> Lamar BlinksBRUCE J Josyah Achor MD ELECTRONICALLY SIGNED 08/21/2014 13:47

## 2014-12-14 NOTE — Discharge Summary (Signed)
PATIENT NAME:  Ethan White, Ethan L MR#:  White DATE OF BIRTH:  06-Apr-1961  DATE OF ADMISSION:  08/17/2014 DATE OF DISCHARGE:  08/18/2014  Please note, the patient left the hospital without informing our staff. He has not signed against medical advice papers either.   DISCHARGE DIAGNOSES:  1.  Unstable angina.  2.  Non-ST-elevation myocardial infarction. 3.  Coronary artery disease status post stenting x6. 4.  Hypertension.  5.  Hyperlipidemia.  6.  Chronic neck pain.   SECONDARY DIAGNOSES: History of multiple neck surgeries.   CONSULTATION: Cardiology, Dr. Arnoldo HookerBruce Kowalski.   PROCEDURES AND RADIOLOGY: Chest x-ray on the 26th of December showed no acute cardiopulmonary disease. Stable 4 mm nodule in right mid lung.  HISTORY AND SHORT HOSPITAL COURSE: The patient is a 54 year old male with above-mentioned medical problems who was admitted for chest pain. Please see Dr. Suzanne BoronKonidena's dictated history and physical for further details. The patient ruled in for non-ST elevation MI. Cardiology consultation was obtained with Dr. Arnoldo HookerBruce Kowalski who recommended cardiac cath, which was scheduled to be done on the 28th of December, but the patient left from the hospital without informing hospital staff on 27th of December. He did not sign AMA paper.  TOTAL TIME TAKING CARE OF THIS PATIENT: 40 minutes. ____________________________ [name], MD vss:sb D: 08/19/2014 07:29:59 ET T: 08/19/2014 07:36:11 ET JOB#: [id number]  cc: Zenda Herskowitz S. Sherryll BurgerShah, MD, <Dictator> Lamar BlinksBruce J. Kowalski, MD Primary Care Physician  Ellamae SiaVIPUL S [facility] MD ELECTRONICALLY SIGNED 08/19/2014 8:46

## 2014-12-18 NOTE — H&P (Signed)
PATIENT NAME:  Ethan White, Ethan White MR#:  [id number] DATE OF BIRTH:  1961-03-23  DATE OF ADMISSION:  08/17/2014  PRIMARY CARE PHYSICIAN: Nonlocal.   EMERGENCY ROOM PHYSICIAN: Dr. Daryel November.   CHIEF COMPLAINT: Chest pain.   HISTORY OF PRESENT ILLNESS: This patient is a 54 year old male patient with coronary artery disease, 6 stents placed, comes in because of chest pain. The patient has chest pain for the past 2 days. Chest pain been started on the right side, moved to the middle of the chest, radiated to the left arm, and he had a near syncopal episode yesterday. He did have some dizziness yesterday, but denies any nausea, vomiting, or diaphoresis. He noted some cough with no phlegm, but had a fever of 104 last night. No trouble breathing. The patient's initial troponins were elevated at 1.30. His chest pain was not relieved with nitroglycerin that he took at home. He received a dose of Dilaudid 1 mg IV push, which helped him with the chest pain. I spoke with Dr. Gwen Pounds who is on-call for cardiology. He recommended admitting him to the hospital here, start heparin drip, and he will see the patient.   PAST MEDICAL HISTORY: Significant for hypertension, CAD. The patient had 6 stents, 3 of them done in 2008 and 3 stents in 2006. He had a cardiac cath last year in this hospital, and his cardiac at home June 2014 showed 50% stenosis at mid-LAD at the site of prior stent 70% in circumflex stenosis and RCA 0% stenosis.   OTHER MEDICAL PROBLEMS: Include hypertension, hyperlipidemia, chronic neck pain.   PAST SURGICAL HISTORY: Significant for 6 stents placed, history for finger reconstruction in the right upper extremity, and multiple neck surgeries at C5-C6.   ALLERGIES: HE IS ALLERGIC TO TORADOL  AND TRAMADOL.  PSYCHOSOCIAL HISTORY: The patient smokes about 10 cigarettes a day. No alcohol. No drugs.   FAMILY HISTORY: Significant for hypertension to the mother.   REVIEW OF  SYSTEMS: CONSTITUTIONAL: No fever. No fatigue.  EYES: No blurred vision.  EARS, NOSE, AND THROAT: Denies any epistaxis. No ear pain. No difficulty swallowing.  RESPIRATORY: The patient does have some cough and COPD.  CARDIOVASCULAR: Does have chest pain, near syncope.  GASTROINTESTINAL: Has some nausea.  GENITOURINARY: No dysuria or hematuria.  ENDOCRINE: No polyuria or polydipsia.  HEMATOLOGIC: No anemia.  MUSCULOSKELETAL: Complains of neck pain and chronic body pains.  NEUROLOGIC: No history of strokes or TIA.  PSYCHIATRIC: No anxiety or insomnia.   MEDICATIONS: diazepam 10 mg p.o. t.i.d., enalapril 20 mg p.o. daily, Percocet 7.5/300 one to 2 tablets every 6 hours as needed for pain.   PAST SURGICAL HISTORY: Significant for 6 stents placed and also history of cervical fusion.   PHYSICAL EXAMINATION: VITAL SIGNS: Temperature 100.6, heart rate 99, temperature 100.6, heart rate 99, blood pressure 106/68, sats 95% on room air. Alert, awake, oriented, not in distress, answering questions appropriately.  HEAD: Atraumatic, normocephalic.  EYES: Pupils equal, reacting to light. No scleral icterus extraocular movements intact.  NOSE: No nasal lesions. No redness. EARS: No drainage,no carotid bruit NECK: Supple. No JVD. No carotid bruit. Thyroid in the midline. Normal range of motion. RESPIRATORY: Bilaterally clear to auscultation. No wheeze, no rales.  CARDIOVASCULAR: S1, S2 regular. No murmurs. PMI not displaced. No pedal edema. Femoral pulses and pedal pulses are intact.  GASTROINTESTINAL: Abdomen is soft, nontender, nondistended. Bowel sounds present. No organomegaly. No hernias.  MUSCULOSKELETAL: Normal gait and station, able to move more all extremities x  4.  SKIN: Inspection is normal. No diaphoresis.  NEUROLOGIC: Alert, awake, oriented. Cranial nerves II through XII are intact.  Motor strength 5/5 in upper and lower extremities.  Sensory intact. DTRs 2+ bilaterally. PSYCHIATRIC: Mood  and affect are within normal limits.   LABORATORY DATA: Troponin 1.30, CK total 1576, CPK-MB 1.9, INR 1. Chest x-ray shows no consolidation or effusion. A 4 mm nodular density in the right mid lung unchanged. WBC 7.5, hemoglobin is 12.1, hematocrit 36.>, platelets 127. Electrolytes: Sodium is 135, potassium 4.1, chloride 102, bicarbonate 28 BUN 19, creatinine 1.06, glucose 97. EKG: Normal sinus rhythm with 100 beats per minute. No ST or T changes.   ASSESSMENT AND PLAN:  121.  A 54 year old male with coronary artery disease, 6  stents placed, comes in with chest pain and elevated troponins. The patient does have a non-ST-elevation myocardial infarction.  2.  Diagnosis of hypertension.  3.  Chronic neck pains.   PLAN:  2.  The patient is admitted to telemetry. Start him on a heparin drip, aspirin, beta blocker, statins, and nitrates. The patient will be seen by Dr. Gwen PoundsKowalski and possible cardiac catheterization on Monday, Cycle the troponins 2 more times, and continue him on the above medications and also add ACE inhibitors.  2.  Tobacco abuse. Counseled for 5 minutes about smoking cessation. He said he is in the process of quitting.  3.  Chronic neck pains. Requesting IV Dilaudid. He said he ran out of medications. We restarted his Percocet.  4.  I discussed stratification. The patient will get fasting lipase and hemoglobin A1c tomorrow.   TIME SPENT: 55 minutes    ____________________________ Katha HammingSnehalatha Willie Loy, MD sk:mw D: 08/17/2014 14:08:00 ET T: 08/17/2014 14:23:33 ET JOB#: [id number]  cc: Katha HammingSnehalatha Lequita Meadowcroft, MD, <Dictator> Katha HammingSNEHALATHA Divon Krabill MD ELECTRONICALLY SIGNED 09/03/2014 22:02

## 2016-11-12 DIAGNOSIS — R079 Chest pain, unspecified: Secondary | ICD-10-CM

## 2017-04-06 DIAGNOSIS — I2510 Atherosclerotic heart disease of native coronary artery without angina pectoris: Secondary | ICD-10-CM | POA: Diagnosis not present

## 2017-04-06 DIAGNOSIS — R079 Chest pain, unspecified: Secondary | ICD-10-CM | POA: Diagnosis not present

## 2017-04-06 DIAGNOSIS — I10 Essential (primary) hypertension: Secondary | ICD-10-CM | POA: Diagnosis not present

## 2017-04-06 DIAGNOSIS — E785 Hyperlipidemia, unspecified: Secondary | ICD-10-CM | POA: Diagnosis not present

## 2017-04-07 ENCOUNTER — Inpatient Hospital Stay (HOSPITAL_COMMUNITY)
Admission: AD | Admit: 2017-04-07 | Discharge: 2017-04-09 | DRG: 287 | Discharge status: Against Medical Advice | Source: Other Acute Inpatient Hospital | Attending: Internal Medicine | Admitting: Internal Medicine

## 2017-04-07 DIAGNOSIS — I110 Hypertensive heart disease with heart failure: Secondary | ICD-10-CM | POA: Diagnosis not present

## 2017-04-07 DIAGNOSIS — D649 Anemia, unspecified: Secondary | ICD-10-CM

## 2017-04-07 DIAGNOSIS — Z981 Arthrodesis status: Secondary | ICD-10-CM

## 2017-04-07 DIAGNOSIS — R0789 Other chest pain: Principal | ICD-10-CM | POA: Diagnosis present

## 2017-04-07 DIAGNOSIS — G8929 Other chronic pain: Secondary | ICD-10-CM | POA: Diagnosis present

## 2017-04-07 DIAGNOSIS — J8410 Pulmonary fibrosis, unspecified: Secondary | ICD-10-CM | POA: Diagnosis present

## 2017-04-07 DIAGNOSIS — Z955 Presence of coronary angioplasty implant and graft: Secondary | ICD-10-CM

## 2017-04-07 DIAGNOSIS — Z7982 Long term (current) use of aspirin: Secondary | ICD-10-CM

## 2017-04-07 DIAGNOSIS — I252 Old myocardial infarction: Secondary | ICD-10-CM

## 2017-04-07 DIAGNOSIS — E785 Hyperlipidemia, unspecified: Secondary | ICD-10-CM | POA: Diagnosis not present

## 2017-04-07 DIAGNOSIS — Z87891 Personal history of nicotine dependence: Secondary | ICD-10-CM

## 2017-04-07 DIAGNOSIS — R451 Restlessness and agitation: Secondary | ICD-10-CM | POA: Diagnosis not present

## 2017-04-07 DIAGNOSIS — Z886 Allergy status to analgesic agent status: Secondary | ICD-10-CM

## 2017-04-07 DIAGNOSIS — R6881 Early satiety: Secondary | ICD-10-CM | POA: Diagnosis not present

## 2017-04-07 DIAGNOSIS — Z7902 Long term (current) use of antithrombotics/antiplatelets: Secondary | ICD-10-CM | POA: Diagnosis not present

## 2017-04-07 DIAGNOSIS — D696 Thrombocytopenia, unspecified: Secondary | ICD-10-CM

## 2017-04-07 DIAGNOSIS — I10 Essential (primary) hypertension: Secondary | ICD-10-CM | POA: Diagnosis not present

## 2017-04-07 DIAGNOSIS — R079 Chest pain, unspecified: Secondary | ICD-10-CM | POA: Diagnosis present

## 2017-04-07 DIAGNOSIS — Z79899 Other long term (current) drug therapy: Secondary | ICD-10-CM | POA: Diagnosis not present

## 2017-04-07 DIAGNOSIS — M549 Dorsalgia, unspecified: Secondary | ICD-10-CM | POA: Diagnosis present

## 2017-04-07 DIAGNOSIS — I5022 Chronic systolic (congestive) heart failure: Secondary | ICD-10-CM

## 2017-04-07 DIAGNOSIS — I2510 Atherosclerotic heart disease of native coronary artery without angina pectoris: Secondary | ICD-10-CM | POA: Diagnosis not present

## 2017-04-07 DIAGNOSIS — K219 Gastro-esophageal reflux disease without esophagitis: Secondary | ICD-10-CM | POA: Diagnosis not present

## 2017-04-07 DIAGNOSIS — I700 Atherosclerosis of aorta: Secondary | ICD-10-CM | POA: Diagnosis present

## 2017-04-07 DIAGNOSIS — I200 Unstable angina: Secondary | ICD-10-CM | POA: Insufficient documentation

## 2017-04-07 HISTORY — DX: Atherosclerotic heart disease of native coronary artery without angina pectoris: I25.10

## 2017-04-07 HISTORY — DX: Heart failure, unspecified: I50.9

## 2017-04-07 LAB — APTT: aPTT: 31 seconds (ref 24–36)

## 2017-04-07 LAB — CBC WITH DIFFERENTIAL/PLATELET
Basophils Absolute: 0 10*3/uL (ref 0.0–0.1)
Basophils Relative: 0 %
Eosinophils Absolute: 0.1 10*3/uL (ref 0.0–0.7)
Eosinophils Relative: 2 %
HCT: 36.2 % — ABNORMAL LOW (ref 39.0–52.0)
Hemoglobin: 12.5 g/dL — ABNORMAL LOW (ref 13.0–17.0)
Lymphocytes Relative: 38 %
Lymphs Abs: 1.8 10*3/uL (ref 0.7–4.0)
MCH: 31.7 pg (ref 26.0–34.0)
MCHC: 34.5 g/dL (ref 30.0–36.0)
MCV: 91.9 fL (ref 78.0–100.0)
Monocytes Absolute: 0.6 10*3/uL (ref 0.1–1.0)
Monocytes Relative: 13 %
Neutro Abs: 2.3 10*3/uL (ref 1.7–7.7)
Neutrophils Relative %: 47 %
Platelets: 128 10*3/uL — ABNORMAL LOW (ref 150–400)
RBC: 3.94 MIL/uL — ABNORMAL LOW (ref 4.22–5.81)
RDW: 12.6 % (ref 11.5–15.5)
WBC: 4.8 10*3/uL (ref 4.0–10.5)

## 2017-04-07 LAB — COMPREHENSIVE METABOLIC PANEL
ALT: 22 U/L (ref 17–63)
AST: 23 U/L (ref 15–41)
Albumin: 3.5 g/dL (ref 3.5–5.0)
Alkaline Phosphatase: 57 U/L (ref 38–126)
Anion gap: 7 (ref 5–15)
BUN: 16 mg/dL (ref 6–20)
CO2: 26 mmol/L (ref 22–32)
Calcium: 8.8 mg/dL — ABNORMAL LOW (ref 8.9–10.3)
Chloride: 104 mmol/L (ref 101–111)
Creatinine, Ser: 0.97 mg/dL (ref 0.61–1.24)
GFR calc Af Amer: >60 mL/min (ref >60)
GFR calc non Af Amer: >60 mL/min (ref >60)
Glucose, Bld: 93 mg/dL (ref 65–99)
Potassium: 4 mmol/L (ref 3.5–5.1)
Sodium: 137 mmol/L (ref 135–145)
Total Bilirubin: 0.7 mg/dL (ref 0.3–1.2)
Total Protein: 6.5 g/dL (ref 6.5–8.1)

## 2017-04-07 LAB — PROTIME-INR
INR: 0.95
Prothrombin Time: 12.7 seconds (ref 11.4–15.2)

## 2017-04-07 LAB — MRSA PCR SCREENING: MRSA by PCR: NEGATIVE

## 2017-04-07 LAB — TROPONIN I: Troponin I: <0.03 ng/mL (ref <0.03)

## 2017-04-07 MED ORDER — MORPHINE SULFATE (PF) 2 MG/ML IV SOLN
1.0000 mg | Freq: Once | INTRAVENOUS | Status: AC
  Administered 2017-04-07: 1 mg via INTRAVENOUS
  Filled 2017-04-07: qty 1

## 2017-04-07 MED ORDER — ASPIRIN EC 81 MG PO TBEC
81.0000 mg | DELAYED_RELEASE_TABLET | Freq: Every day | ORAL | Status: DC
  Administered 2017-04-08: 81 mg via ORAL
  Filled 2017-04-07: qty 1

## 2017-04-07 MED ORDER — ONDANSETRON HCL 4 MG/2ML IJ SOLN: 4.0000 mg | Freq: Four times a day (QID) | INTRAMUSCULAR | Status: DC | PRN

## 2017-04-07 MED ORDER — ATORVASTATIN CALCIUM 40 MG PO TABS
40.0000 mg | ORAL_TABLET | Freq: Every day | ORAL | Status: DC
  Administered 2017-04-08: 40 mg via ORAL
  Filled 2017-04-07: qty 1

## 2017-04-07 MED ORDER — PANTOPRAZOLE SODIUM 40 MG PO TBEC
40.0000 mg | DELAYED_RELEASE_TABLET | Freq: Every day | ORAL | Status: DC
  Administered 2017-04-08: 40 mg via ORAL
  Filled 2017-04-07: qty 1

## 2017-04-07 MED ORDER — NITROGLYCERIN 0.4 MG SL SUBL
0.4000 mg | SUBLINGUAL_TABLET | SUBLINGUAL | Status: DC | PRN
  Administered 2017-04-08 (×3): 0.4 mg via SUBLINGUAL
  Filled 2017-04-07: qty 1

## 2017-04-07 MED ORDER — TRAMADOL HCL 50 MG PO TABS: 100.0000 mg | ORAL_TABLET | Freq: Three times a day (TID) | ORAL | Status: DC | PRN

## 2017-04-07 MED ORDER — CLOPIDOGREL BISULFATE 75 MG PO TABS
75.0000 mg | ORAL_TABLET | Freq: Every day | ORAL | Status: DC
  Administered 2017-04-08: 75 mg via ORAL
  Filled 2017-04-07: qty 1

## 2017-04-07 MED ORDER — METOPROLOL SUCCINATE ER 100 MG PO TB24
100.0000 mg | ORAL_TABLET | Freq: Every day | ORAL | Status: DC
  Filled 2017-04-07: qty 1

## 2017-04-07 MED ORDER — ACETAMINOPHEN 325 MG PO TABS: 650.0000 mg | ORAL_TABLET | ORAL | Status: DC | PRN

## 2017-04-07 NOTE — H&P (Addendum)
Cardiology History & Physical    Patient ID: Ethan White MRN: [id number], DOB: 08/31/1960 Date of Encounter: 04/07/2017, 10:31 PM Primary Physician: No primary care provider on file.  Chief Complaint: Chest pain   HPI: Ethan White is a 56 y.o. male with CAD s/p PCI to LAD/D1 bifurcation (x2, most recently in 11/2015), HTN, HLD, and possibly a h/o HFrEF who presents as a transfer from [facility] with CP.  He initially had CP at rest that began approximately 36 hours prior to my exam, substernal in location, radiating to jaw and down the left arm.   This was associated with intermittent clamminess and NBNB emesis x 1. His pain has waxed and waned, but has never completely gone away since the time of onset.  He presented to [facility], where serial CBMs were negative.  ECGs did not show any notable ischemic changes.  He had a TTE that showed LVEF 35% and RWMA, but no report or disc are available.  He was started on ranolazine and transferred to [facility] for further evaluation and consideration of cardiac catheterization.  Upon my interview, pt reports 9/10 CP, but appears comfortable.  PMH: CAD, HTN, HLD, pt reports HFrEF with EF as low as 15% previously.  Surgical History: No past surgical history on file.   Home Meds: Acetaminophen 650 mg TID ASA 81 mg QD Plavix 75 mg QD Metoprolol succinate 100 mg Q12H SL Nitroglycerin PRN Tramadol 100 mg TID Pantoprazole 40 mg QD  Allergies: Toradol  Social History   Social History  . Marital status: Single    Spouse name: N/A  . Number of children: N/A  . Years of education: N/A   Occupational History  . Not on file.   Social History Main Topics  . Smoking status: Not on file  . Smokeless tobacco: Not on file  . Alcohol use Not on file  . Drug use: Unknown  . Sexual activity: Not on file   Other Topics Concern  . Not on file   Social History Narrative  . No narrative on file    Lives in [place], was  previously a Education administrator.  Former smoker.  Review of Systems: All other systems reviewed and are otherwise negative except as noted above.  Labs (OSH):  BMP: 142/4.5  107/27  21/0.8 <88 LFTs, coags WNL CBC: 4.8 > 12.9/38.5 < 108 Troponin negative x 2   Radiology/Studies:  No results found. Wt Readings from Last 3 Encounters:  04/07/17 71 kg (156 lb 8.4 oz)    EKG: SR with multiple PACs, PRWP, prior septal infarct.  No acute ST-TW changes.  Physical Exam: Blood pressure 137/74, pulse (!) 57, temperature 98.4 F (36.9 C), temperature source Oral, resp. rate 10, height 6\' 2"  (1.88 m), weight 71 kg (156 lb 8.4 oz), SpO2 98 %. Body mass index is 20.1 kg/m. General: Well developed, well nourished, in no acute distress.  Numerous tattoos Head: Normocephalic, atraumatic, sclera non-icteric, no xanthomas, nares are without discharge.  Neck: Negative for carotid bruits. JVD not elevated. Lungs: Clear bilaterally to auscultation without wheezes, rales, or rhonchi. Breathing is unlabored. Heart: RRR with S1 S2. No murmurs, rubs, or gallops appreciated. Abdomen: Soft, non-tender, non-distended with normoactive bowel sounds. No hepatomegaly. No rebound/guarding. No obvious abdominal masses. Msk:  Strength and tone appear normal for age. Extremities: No clubbing or cyanosis. No edema.  Distal pedal pulses are 2+ and equal bilaterally. Neuro: Alert and oriented X 3. No focal deficit. No  facial asymmetry. Moves all extremities spontaneously. Psych:  Responds to questions appropriately with a normal affect.    Assessment and Plan  56 y.o. male with CAD s/p PCI to LAD/D1 bifurcation (x2, most recently in 11/2015), HTN, HLD, and possibly a h/o HFrEF who presents as a transfer from [facility] with CP.  1.  CP: Known CAD history; would wonder about whether the LAD/D1 bifucation lesion has restenosed.  However, he has had >36 hours of unremitting CP with negative CBMs, which is extremely  reassuring.  His echo reportedly showed RWMA, but per patient he has a prior history of reduced EF.  Will check repeat biomarkers here.  Will hold off on heparinization, unless pt has positive markers or dynamic ECG changes with further monitoring.  Will keep NPO for possible cath or stress in AM.  Continue home ASA, Plavix, statin.  Dose reduce metoprolol given HR in 50s.  2.  Low EF: Reportedly 35% at OSH, appears euvolemic on exam.  Plan to track down report and films in AM.  If unable to obtain would repeat echo here. Pt is on beta blocker at home but not ACE-I. Once objective data is available, can decide on appropriate GDMT for reduced EF.  3.  Chronic back pain, s/p spinal fusion:  Continue home acetaminophen and tramadol.  Benita StabileSigned, Denzil Bristol MD 04/07/2017, 10:31 PM

## 2017-04-07 NOTE — Progress Notes (Signed)
Pt arrived via [facility] at 2030 from [facility] with CP present 9/10. [facility] did administer morphine 4mg  IV in route with temporary relief. Vitals WNL at this time. Admitting services notified at 2040. Pt made aware that Dr. With [facility] will be coming to admit him to our hospital. Pt on monitor and CCMD notified of pt's present. Will continue to monitor at this time.

## 2017-04-08 ENCOUNTER — Encounter (HOSPITAL_COMMUNITY)
Admission: AD | Discharge status: Against Medical Advice | Payer: Self-pay | Source: Other Acute Inpatient Hospital | Attending: Cardiology

## 2017-04-08 ENCOUNTER — Inpatient Hospital Stay (HOSPITAL_COMMUNITY)

## 2017-04-08 ENCOUNTER — Observation Stay (HOSPITAL_COMMUNITY)

## 2017-04-08 ENCOUNTER — Encounter (HOSPITAL_COMMUNITY): Payer: Self-pay | Admitting: Cardiology

## 2017-04-08 DIAGNOSIS — M549 Dorsalgia, unspecified: Secondary | ICD-10-CM | POA: Diagnosis present

## 2017-04-08 DIAGNOSIS — Z7982 Long term (current) use of aspirin: Secondary | ICD-10-CM | POA: Diagnosis not present

## 2017-04-08 DIAGNOSIS — G8929 Other chronic pain: Secondary | ICD-10-CM | POA: Diagnosis present

## 2017-04-08 DIAGNOSIS — E785 Hyperlipidemia, unspecified: Secondary | ICD-10-CM

## 2017-04-08 DIAGNOSIS — I10 Essential (primary) hypertension: Secondary | ICD-10-CM

## 2017-04-08 DIAGNOSIS — I700 Atherosclerosis of aorta: Secondary | ICD-10-CM | POA: Diagnosis present

## 2017-04-08 DIAGNOSIS — R079 Chest pain, unspecified: Secondary | ICD-10-CM

## 2017-04-08 DIAGNOSIS — I340 Nonrheumatic mitral (valve) insufficiency: Secondary | ICD-10-CM

## 2017-04-08 DIAGNOSIS — D649 Anemia, unspecified: Secondary | ICD-10-CM | POA: Diagnosis present

## 2017-04-08 DIAGNOSIS — I5022 Chronic systolic (congestive) heart failure: Secondary | ICD-10-CM | POA: Diagnosis present

## 2017-04-08 DIAGNOSIS — I110 Hypertensive heart disease with heart failure: Secondary | ICD-10-CM | POA: Diagnosis present

## 2017-04-08 DIAGNOSIS — Z955 Presence of coronary angioplasty implant and graft: Secondary | ICD-10-CM | POA: Diagnosis not present

## 2017-04-08 DIAGNOSIS — I2510 Atherosclerotic heart disease of native coronary artery without angina pectoris: Secondary | ICD-10-CM

## 2017-04-08 DIAGNOSIS — K219 Gastro-esophageal reflux disease without esophagitis: Secondary | ICD-10-CM | POA: Diagnosis present

## 2017-04-08 DIAGNOSIS — Z79899 Other long term (current) drug therapy: Secondary | ICD-10-CM | POA: Diagnosis not present

## 2017-04-08 DIAGNOSIS — Z886 Allergy status to analgesic agent status: Secondary | ICD-10-CM | POA: Diagnosis not present

## 2017-04-08 DIAGNOSIS — R6881 Early satiety: Secondary | ICD-10-CM | POA: Diagnosis present

## 2017-04-08 DIAGNOSIS — I200 Unstable angina: Secondary | ICD-10-CM

## 2017-04-08 DIAGNOSIS — Z7902 Long term (current) use of antithrombotics/antiplatelets: Secondary | ICD-10-CM | POA: Diagnosis not present

## 2017-04-08 DIAGNOSIS — R0789 Other chest pain: Secondary | ICD-10-CM | POA: Diagnosis present

## 2017-04-08 DIAGNOSIS — Z87891 Personal history of nicotine dependence: Secondary | ICD-10-CM | POA: Diagnosis not present

## 2017-04-08 DIAGNOSIS — D696 Thrombocytopenia, unspecified: Secondary | ICD-10-CM | POA: Diagnosis present

## 2017-04-08 DIAGNOSIS — I252 Old myocardial infarction: Secondary | ICD-10-CM | POA: Diagnosis not present

## 2017-04-08 DIAGNOSIS — R451 Restlessness and agitation: Secondary | ICD-10-CM | POA: Diagnosis present

## 2017-04-08 DIAGNOSIS — Z981 Arthrodesis status: Secondary | ICD-10-CM | POA: Diagnosis not present

## 2017-04-08 DIAGNOSIS — J8410 Pulmonary fibrosis, unspecified: Secondary | ICD-10-CM | POA: Diagnosis present

## 2017-04-08 HISTORY — PX: LEFT HEART CATH AND CORONARY ANGIOGRAPHY: CATH118249

## 2017-04-08 HISTORY — DX: Atherosclerotic heart disease of native coronary artery without angina pectoris: I25.10

## 2017-04-08 LAB — ECHOCARDIOGRAM COMPLETE
Height: 74 in
WEIGHTICAEL: 2447.99 [oz_av]

## 2017-04-08 LAB — BASIC METABOLIC PANEL
ANION GAP: 8 (ref 5–15)
BUN: 15 mg/dL (ref 6–20)
CALCIUM: 9 mg/dL (ref 8.9–10.3)
CO2: 25 mmol/L (ref 22–32)
CREATININE: 0.9 mg/dL (ref 0.61–1.24)
Chloride: 105 mmol/L (ref 101–111)
GFR calc non Af Amer: >60 mL/min (ref >60)
Glucose, Bld: 94 mg/dL (ref 65–99)
Potassium: 4.3 mmol/L (ref 3.5–5.1)
SODIUM: 138 mmol/L (ref 135–145)

## 2017-04-08 LAB — CBC
HCT: 36.5 % — ABNORMAL LOW (ref 39.0–52.0)
Hemoglobin: 12.8 g/dL — ABNORMAL LOW (ref 13.0–17.0)
MCH: 32.1 pg (ref 26.0–34.0)
MCHC: 35.1 g/dL (ref 30.0–36.0)
MCV: 91.5 fL (ref 78.0–100.0)
PLATELETS: 128 10*3/uL — AB (ref 150–400)
RBC: 3.99 MIL/uL — ABNORMAL LOW (ref 4.22–5.81)
RDW: 12.6 % (ref 11.5–15.5)
WBC: 4 10*3/uL (ref 4.0–10.5)

## 2017-04-08 LAB — TROPONIN I
Troponin I: <0.03 ng/mL (ref <0.03)
Troponin I: <0.03 ng/mL (ref <0.03)

## 2017-04-08 LAB — PROTIME-INR
INR: 0.98
PROTHROMBIN TIME: 13 s (ref 11.4–15.2)

## 2017-04-08 LAB — HIV ANTIBODY (ROUTINE TESTING W REFLEX): HIV Screen 4th Generation wRfx: NONREACTIVE

## 2017-04-08 SURGERY — LEFT HEART CATH AND CORONARY ANGIOGRAPHY
Anesthesia: LOCAL

## 2017-04-08 MED ORDER — SODIUM CHLORIDE 0.9 % IV SOLN: 250.0000 mL | INTRAVENOUS | Status: DC | PRN

## 2017-04-08 MED ORDER — SODIUM CHLORIDE 0.9 % WEIGHT BASED INFUSION
1.0000 mL/kg/h | INTRAVENOUS | Status: DC
  Administered 2017-04-08: 1 mL/kg/h via INTRAVENOUS

## 2017-04-08 MED ORDER — MIDAZOLAM HCL 2 MG/2ML IJ SOLN
INTRAMUSCULAR | Status: DC | PRN
  Administered 2017-04-08: 1 mg via INTRAVENOUS

## 2017-04-08 MED ORDER — LIDOCAINE HCL (PF) 1 % IJ SOLN
INTRAMUSCULAR | Status: DC | PRN
  Administered 2017-04-08: 2 mL

## 2017-04-08 MED ORDER — HEPARIN (PORCINE) IN NACL 2-0.9 UNIT/ML-% IJ SOLN
INTRAMUSCULAR | Status: AC | PRN
  Administered 2017-04-08: 1000 mL

## 2017-04-08 MED ORDER — SODIUM CHLORIDE 0.9% FLUSH: 3.0000 mL | INTRAVENOUS | Status: DC | PRN

## 2017-04-08 MED ORDER — LIDOCAINE HCL (PF) 1 % IJ SOLN
INTRAMUSCULAR | Status: AC
  Filled 2017-04-08: qty 30

## 2017-04-08 MED ORDER — SODIUM CHLORIDE 0.9 % WEIGHT BASED INFUSION
1.0000 mL/kg/h | INTRAVENOUS | Status: AC
  Administered 2017-04-08: 1 mL/kg/h via INTRAVENOUS

## 2017-04-08 MED ORDER — HEPARIN SODIUM (PORCINE) 1000 UNIT/ML IJ SOLN
INTRAMUSCULAR | Status: DC | PRN
  Administered 2017-04-08: 3500 [IU] via INTRAVENOUS

## 2017-04-08 MED ORDER — IOPAMIDOL (ISOVUE-370) INJECTION 76%
INTRAVENOUS | Status: AC
  Filled 2017-04-08: qty 100

## 2017-04-08 MED ORDER — PANTOPRAZOLE SODIUM 40 MG PO TBEC
40.0000 mg | DELAYED_RELEASE_TABLET | Freq: Two times a day (BID) | ORAL | Status: DC
  Administered 2017-04-08: 40 mg via ORAL
  Filled 2017-04-08: qty 1

## 2017-04-08 MED ORDER — HEPARIN SODIUM (PORCINE) 1000 UNIT/ML IJ SOLN
INTRAMUSCULAR | Status: AC
  Filled 2017-04-08: qty 1

## 2017-04-08 MED ORDER — SODIUM CHLORIDE 0.9% FLUSH
3.0000 mL | Freq: Two times a day (BID) | INTRAVENOUS | Status: DC
  Administered 2017-04-08: 3 mL via INTRAVENOUS

## 2017-04-08 MED ORDER — SODIUM CHLORIDE 0.9 % WEIGHT BASED INFUSION
3.0000 mL/kg/h | INTRAVENOUS | Status: DC
  Administered 2017-04-08: 3 mL/kg/h via INTRAVENOUS

## 2017-04-08 MED ORDER — HEPARIN (PORCINE) IN NACL 2-0.9 UNIT/ML-% IJ SOLN
INTRAMUSCULAR | Status: AC
  Filled 2017-04-08: qty 1000

## 2017-04-08 MED ORDER — FENTANYL CITRATE (PF) 100 MCG/2ML IJ SOLN
INTRAMUSCULAR | Status: DC | PRN
  Administered 2017-04-08: 25 ug via INTRAVENOUS

## 2017-04-08 MED ORDER — IOPAMIDOL (ISOVUE-370) INJECTION 76%
INTRAVENOUS | Status: DC | PRN
  Administered 2017-04-08: 75 mL

## 2017-04-08 MED ORDER — MIDAZOLAM HCL 2 MG/2ML IJ SOLN
INTRAMUSCULAR | Status: AC
  Filled 2017-04-08: qty 2

## 2017-04-08 MED ORDER — VERAPAMIL HCL 2.5 MG/ML IV SOLN
INTRAVENOUS | Status: DC | PRN
  Administered 2017-04-08: 10 mL via INTRA_ARTERIAL

## 2017-04-08 MED ORDER — ASPIRIN 81 MG PO CHEW: 81.0000 mg | CHEWABLE_TABLET | ORAL | Status: AC

## 2017-04-08 MED ORDER — VERAPAMIL HCL 2.5 MG/ML IV SOLN
INTRAVENOUS | Status: AC
  Filled 2017-04-08: qty 2

## 2017-04-08 MED ORDER — MORPHINE SULFATE (PF) 2 MG/ML IV SOLN
2.0000 mg | Freq: Once | INTRAVENOUS | Status: AC
  Administered 2017-04-08: 2 mg via INTRAVENOUS
  Filled 2017-04-08: qty 1

## 2017-04-08 MED ORDER — FENTANYL CITRATE (PF) 100 MCG/2ML IJ SOLN
INTRAMUSCULAR | Status: AC
  Filled 2017-04-08: qty 2

## 2017-04-08 MED FILL — Morphine Sulfate Inj 10 MG/ML: INTRAMUSCULAR | Qty: 1 | Status: AC

## 2017-04-08 SURGICAL SUPPLY — 10 items

## 2017-04-08 NOTE — Progress Notes (Signed)
Progress Note  Patient Name: Ethan White Date of Encounter: 04/08/2017  Primary Cardiologist: Allyson Sabal  Subjective   Ethan White continues to have chest pain rating to his left upper extremity. He is not on IV heparin. His enzymes are negative.`  Inpatient Medications    Scheduled Meds: . aspirin EC  81 mg Oral Daily  . atorvastatin  40 mg Oral q1800  . clopidogrel  75 mg Oral Daily  . metoprolol succinate  100 mg Oral Daily  . pantoprazole  40 mg Oral Daily   Continuous Infusions:  PRN Meds: acetaminophen, nitroGLYCERIN, ondansetron (ZOFRAN) IV, traMADol   Vital Signs    Vitals:   04/07/17 2030 04/07/17 2355 04/08/17 0356 04/08/17 0753  BP: 137/74 127/82 105/70 106/65  Pulse: (!) 57 (!) 55 (!) 48   Resp: 10 12 11    Temp: 98.4 F (36.9 C) 97.8 F (36.6 C) 98.1 F (36.7 C) 98 F (36.7 C)  TempSrc: Oral Oral Oral Oral  SpO2: 98% 97% 98%   Weight: 156 lb 8.4 oz (71 kg)     Height: 6\' 2"  (1.88 m)       Intake/Output Summary (Last 24 hours) at 04/08/17 0855 Last data filed at 04/08/17 0356  Gross per 24 hour  Intake                0 ml  Output              800 ml  Net             -800 ml   Filed Weights   04/07/17 2030  Weight: 156 lb 8.4 oz (71 kg)    Telemetry    Sinus rhythm with PVCs - Personally Reviewed  ECG    Normal sinus rhythm with PACs, septal Q waves. There were no acute ST or T-wave changes. - Personally Reviewed  Physical Exam   GEN: No acute distress.   Neck: No JVD Cardiac: RRR, no murmurs, rubs, or gallops.  Respiratory: Clear to auscultation bilaterally. GI: Soft, nontender, non-distended  MS: No edema; No deformity. Neuro:  Nonfocal  Psych: Normal affect   Labs    Chemistry Recent Labs Lab 04/07/17 2202  NA 137  K 4.0  CL 104  CO2 26  GLUCOSE 93  BUN 16  CREATININE 0.97  CALCIUM 8.8*  PROT 6.5  ALBUMIN 3.5  AST 23  ALT 22  ALKPHOS 57  BILITOT 0.7  GFRNONAA >60  GFRAA >60  ANIONGAP 7      Hematology Recent Labs Lab 04/07/17 2202  WBC 4.8  RBC 3.94*  HGB 12.5*  HCT 36.2*  MCV 91.9  MCH 31.7  MCHC 34.5  RDW 12.6  PLT 128*    Cardiac Enzymes Recent Labs Lab 04/07/17 2202 04/08/17 0328  TROPONINI <0.03 <0.03   No results for input(s): TROPIPOC in the last 168 hours.   BNPNo results for input(s): BNP, PROBNP in the last 168 hours.   DDimer No results for input(s): DDIMER in the last 168 hours.   Radiology    No results found.  Cardiac Studies   None performed  Patient Profile     56 y.o. male incarcerated and [facility] who has a long history of CAD status post myocardial infarction in 2006 and again in 2014. By his account he's had 4 prior stents most recently at [facility] last year involving LAD and diagonal branch. There is a history of LV dysfunction (EF 35%). Patient  without chest pain yesterday. Has been persistent with left upper extremity radiation and diaphoresis. His enzymes are negative. His EKG shows no acute changes.  Assessment & Plan    1: Coronary artery disease-history of CAD status post myocardial infarction 2006 and 2014 by his account with 4 prior stent procedures most recently one year ago at [facility]. He has ongoing chest pain with negative enzymes and no acute EKG changes. He is not on heparin. Given his ongoing symptoms I feel compelled to image his anatomy and rule out an ischemic etiology. We will perform radial diagnostic coronary angiography today.  Alphonsus Sias, MD  04/08/2017, 8:55 AM

## 2017-04-08 NOTE — Progress Notes (Signed)
Patient complaining of chest pain with 10/10 sublingual nitroglycerin given x3 with no relief.  Pt. VSS with no s/s of distress noted.  Dr. Allyson Sabal updated on patients change in condition.  Will continue to monitor.

## 2017-04-08 NOTE — H&P (View-Only) (Signed)
Progress Note  Patient Name: KEVONTA PHARISS Date of Encounter: 04/08/2017  Primary Cardiologist: Allyson Sabal  Subjective   Mr. Zietlow continues to have chest pain rating to his left upper extremity. He is not on IV heparin. His enzymes are negative.`  Inpatient Medications    Scheduled Meds: . aspirin EC  81 mg Oral Daily  . atorvastatin  40 mg Oral q1800  . clopidogrel  75 mg Oral Daily  . metoprolol succinate  100 mg Oral Daily  . pantoprazole  40 mg Oral Daily   Continuous Infusions:  PRN Meds: acetaminophen, nitroGLYCERIN, ondansetron (ZOFRAN) IV, traMADol   Vital Signs    Vitals:   04/07/17 2030 04/07/17 2355 04/08/17 0356 04/08/17 0753  BP: 137/74 127/82 105/70 106/65  Pulse: (!) 57 (!) 55 (!) 48   Resp: 10 12 11    Temp: 98.4 F (36.9 C) 97.8 F (36.6 C) 98.1 F (36.7 C) 98 F (36.7 C)  TempSrc: Oral Oral Oral Oral  SpO2: 98% 97% 98%   Weight: 156 lb 8.4 oz (71 kg)     Height: 6\' 2"  (1.88 m)       Intake/Output Summary (Last 24 hours) at 04/08/17 0855 Last data filed at 04/08/17 0356  Gross per 24 hour  Intake                0 ml  Output              800 ml  Net             -800 ml   Filed Weights   04/07/17 2030  Weight: 156 lb 8.4 oz (71 kg)    Telemetry    Sinus rhythm with PVCs - Personally Reviewed  ECG    Normal sinus rhythm with PACs, septal Q waves. There were no acute ST or T-wave changes. - Personally Reviewed  Physical Exam   GEN: No acute distress.   Neck: No JVD Cardiac: RRR, no murmurs, rubs, or gallops.  Respiratory: Clear to auscultation bilaterally. GI: Soft, nontender, non-distended  MS: No edema; No deformity. Neuro:  Nonfocal  Psych: Normal affect   Labs    Chemistry Recent Labs Lab 04/07/17 2202  NA 137  K 4.0  CL 104  CO2 26  GLUCOSE 93  BUN 16  CREATININE 0.97  CALCIUM 8.8*  PROT 6.5  ALBUMIN 3.5  AST 23  ALT 22  ALKPHOS 57  BILITOT 0.7  GFRNONAA >60  GFRAA >60  ANIONGAP 7      Hematology Recent Labs Lab 04/07/17 2202  WBC 4.8  RBC 3.94*  HGB 12.5*  HCT 36.2*  MCV 91.9  MCH 31.7  MCHC 34.5  RDW 12.6  PLT 128*    Cardiac Enzymes Recent Labs Lab 04/07/17 2202 04/08/17 0328  TROPONINI <0.03 <0.03   No results for input(s): TROPIPOC in the last 168 hours.   BNPNo results for input(s): BNP, PROBNP in the last 168 hours.   DDimer No results for input(s): DDIMER in the last 168 hours.   Radiology    No results found.  Cardiac Studies   None performed  Patient Profile     56 y.o. male incarcerated and [facility] who has a long history of CAD status post myocardial infarction in 2006 and again in 2014. By his account he's had 4 prior stents most recently at [facility] last year involving LAD and diagonal branch. There is a history of LV dysfunction (EF 35%). Patient  without chest pain yesterday. Has been persistent with left upper extremity radiation and diaphoresis. His enzymes are negative. His EKG shows no acute changes.  Assessment & Plan    1: Coronary artery disease-history of CAD status post myocardial infarction 2006 and 2014 by his account with 4 prior stent procedures most recently one year ago at [facility]. He has ongoing chest pain with negative enzymes and no acute EKG changes. He is not on heparin. Given his ongoing symptoms I feel compelled to image his anatomy and rule out an ischemic etiology. We will perform radial diagnostic coronary angiography today.  Alphonsus Sias, MD  04/08/2017, 8:55 AM

## 2017-04-08 NOTE — H&P (Signed)
Patient unable to finish medical history at this time

## 2017-04-08 NOTE — Interval H&P Note (Signed)
History and Physical Interval Note:  04/08/2017 1:16 PM  Ethan White  has presented today for surgery, with the diagnosis of unstable angina  The various methods of treatment have been discussed with the patient and family. After consideration of risks, benefits and other options for treatment, the patient has consented to  Procedure(s): LEFT HEART CATH AND CORONARY ANGIOGRAPHY (N/A) as a surgical intervention .  The patient's history has been reviewed, patient examined, no change in status, stable for surgery.  I have reviewed the patient's chart and labs.  Questions were answered to the patient's satisfaction.    Cath Lab Visit (complete for each Cath Lab visit)  Clinical Evaluation Leading to the Procedure:   ACS: Yes.    Non-ACS:    Anginal Classification: CCS III  Anti-ischemic medical therapy: Minimal Therapy (1 class of medications)  Non-Invasive Test Results: No non-invasive testing performed  Prior CABG: No previous CABG       Theron Arista [facility] 04/08/2017 1:16 PM'

## 2017-04-08 NOTE — Progress Notes (Signed)
  Echocardiogram 2D Echocardiogram has been performed.  Nolon Rod 04/08/2017, 12:49 PM

## 2017-04-08 NOTE — Progress Notes (Signed)
TR band removed per order

## 2017-04-08 NOTE — Progress Notes (Addendum)
TRANSFER OF CARE NOTE    Ethan White  KGS:[id number] DOB: [birth year] DOA: 04/07/2017 PCP: No primary care provider on file.   Brief Narrative: The patient is a 56 yo Caucasian male with a PMH of CAD s/p PCI, HTN, HLD, and Hx of Systolic CHF with a reduced EF who was transferred from [place] and admitted to Cardiology Service. Patient's CP initially began at rest and was substernal and on the upper side of his chest. There was intermittent clamminess and emesis. Patient was started on Ranolazine and transferred to [facility] and underwent Cardiac Catheterization which was unremarkable. Patient continued to have CP and Cardiology asked to Transfer Care for work up of Non-Cardiac Chest Pain. Of Note patient is currently incarcerated and Emergency planning/management officer is at bedside.  Patient was seen and evaluated and states that his CP is still a 7/10 and still has not gone away. Patient has neck surgery back in 2010 and stated that he had to learn to swallow again. Of note patient has lost 14 pounds within the last few months and has early satiety. [facility] was asked to take over the Care.    Assessment & Plan:   Active Problems:   Chest pain   CAD (coronary artery disease)   Benign essential HTN   HLD (hyperlipidemia)   Chronic systolic CHF (congestive heart failure) (HCC)   Normocytic anemia   Thrombocytopenia (HCC)  Non-Cardiac Chest Pain/Chest Wall Pain ?GERD vs. Visceral Hypersensitivity/ Esophageal Motility Disorder vs. Stress vs. Other Etiology -Increased Protonix to 40 BID -Patient is Allergic to Ketorolac so will avoid NSAIDs -Troponin Negative, EKG Unremarkable, and Cath showed single vessel obstructive CAD. There is a long 99% stenosis in a very small caliber OM2 vessel. This appears to be chronic and the vessel is only 1 mm in diameter. Continued patency of stents in the LAD, diagonal, and RCA. Mild LV dysfunction with EF 45-50%. Distal anterior akinesis.  Normal LVEDP -Will obtain Chest CT w/o  Contrast to evaluate pulmonary causes of Non-Cardiac Chest Pain given recent Cath; No imaging studies were done on admission -C/w IVF -Obtain SLP -HIV Negative  -Will need to Discuss with Gastroenterology for potential EGD given early satiety, anemia, and weight loss  CAD  -Troponin Negative -ECHO as below  -Cath Negative and Unremarkable  -Cardiology s/o -C/w ASA 81 mg, Atorvastatin 40 mg, Clopidogrel 75 mg po daily, Metoprolol 100 mg po Daily, NTG 0.4 mg  -Was not on ACE-I but will likely need to be on one   HTN -C/w Metoprolol 100 mg po Daily  HLD -C/w Atorvastatin 40 mg po qHS  Chronic Systolic CHF with EF of 45-50% on Cath and 35-40% on ECHO -Currently Compensated -Repeat ECHOCardiogram as below -C/w Metoprolol 100 mg po Daily   Chronic Back Pain S/p Spinal Fusion -C/w Home Acetaminophen and Tramadol  Normocytic Anemia -Hb/Hct went from 12.5/36.2 -> 12.8/36.5  -Check Anemia Panel -Repeat CBC in AM  Thrombocytopenia -Patient's Platelet Count of 128 -Repeat CBC in AM   DVT prophylaxis: SCDs Code Status: FULL CODE Family Communication: No family present at bedside Disposition Plan: Home when medically stable  Consultants:   Cardiology Transfer   Procedures:  CARDIAC CATH  Mid LAD to Dist LAD lesion, 0 %stenosed.  Ost 2nd Diag to 2nd Diag lesion, 0 %stenosed.  Ost 2nd Diag lesion, 50 %stenosed.  Prox RCA to Mid RCA lesion, 0 %stenosed.  Prox RCA lesion, 30 %stenosed.  Ost 2nd Mrg to 2nd Mrg lesion, 99 %stenosed.  There is mild left ventricular systolic dysfunction.  LV end diastolic pressure is normal.  The left ventricular ejection fraction is 45-50% by visual estimate.   1. Single vessel obstructive CAD. There is a long 99% stenosis in a very small caliber OM2 vessel. This appears to be chronic and the vessel is only 1 mm in diameter. Continued patency of stents in the LAD, diagonal, and RCA. 2. Mild LV dysfunction with EF 45-50%. Distal  anterior akinesis 3. Normal LVEDP  ECHOCARDIOGRAM Study Conclusions  - Left ventricle: The cavity size was mildly dilated. Systolic   function was moderately reduced. The estimated ejection fraction   was in the range of 35% to 40%. Diffuse hypokinesis. Doppler   parameters are consistent with abnormal left ventricular   relaxation (grade 1 diastolic dysfunction). Doppler parameters   are consistent with indeterminate ventricular filling pressure. - Aortic valve: Transvalvular velocity was within the normal range.   There was no stenosis. There was no regurgitation. - Mitral valve: Transvalvular velocity was within the normal range.   There was no evidence for stenosis. There was mild regurgitation. - Right ventricle: The cavity size was normal. Wall thickness was   normal. Systolic function was normal. - Tricuspid valve: There was trivial regurgitation. - Pulmonary arteries: PA peak pressure: 24 mm Hg (S).   Antimicrobials:  Anti-infectives    None     Subjective: Seen and examined at bedside and still complaining upper chest pain under neck and quantified it as a 7/10. States he has unexplained weight loss and early satiety. No associated changes of pain with eating. Does not know if he has reflux but has been on PPI long term.   Objective: Vitals:   04/08/17 1324 04/08/17 1329 04/08/17 1334 04/08/17 1540  BP: 126/78 120/78 120/75 124/79  Pulse: 69 67 69   Resp: 17 (!) 21 20   Temp:    97.9 F (36.6 C)  TempSrc:    Oral  SpO2: 100% 100% 100%   Weight:      Height:        Intake/Output Summary (Last 24 hours) at 04/08/17 1647 Last data filed at 04/08/17 1132  Gross per 24 hour  Intake                0 ml  Output             1400 ml  Net            -1400 ml   Filed Weights   04/07/17 2030 04/08/17 1033  Weight: 71 kg (156 lb 8.4 oz) 69.4 kg (153 lb)   Examination: Physical Exam:  Constitutional: Caucasian male in NAD and appears calm  Eyes: Lids and  conjunctivae normal, sclerae anicteric  ENMT: External Ears, Nose appear normal. Grossly normal hearing. Mucous membranes are moist. Neck: Appears normal, supple, no cervical masses, normal ROM, no appreciable thyromegaly, no JVD Respiratory: Diminished to auscultation bilaterally, no wheezing, rales, rhonchi or crackles. Normal respiratory effort and patient is not tachypenic. No accessory muscle use.  Cardiovascular: RRR, no murmurs / rubs / gallops. S1 and S2 auscultated. No extremity edema. Abdomen: Soft, non-tender, non-distended. No masses palpated. No appreciable hepatosplenomegaly. Bowel sounds positive x4.  GU: Deferred. Musculoskeletal: No clubbing / cyanosis of digits/nails. No joint deformity upper and lower extremities. No Contractures Skin: No rashes, lesions, ulcers on limited skin eval. No induration; Warm and dry.  Neurologic: CN 2-12 grossly intact with no focal deficits. Sensation intact in all 4.  Romberg sign cerebellar reflexes not assessed.  Psychiatric: Normal judgment and insight. Alert and oriented x 3. Normal mood and appropriate affect.   Data Reviewed: I have personally reviewed following labs and imaging studies  CBC:  Recent Labs Lab 04/07/17 2202 04/08/17 0955  WBC 4.8 4.0  NEUTROABS 2.3  --   HGB 12.5* 12.8*  HCT 36.2* 36.5*  MCV 91.9 91.5  PLT 128* 128*   Basic Metabolic Panel:  Recent Labs Lab 04/07/17 2202 04/08/17 0955  NA 137 138  K 4.0 4.3  CL 104 105  CO2 26 25  GLUCOSE 93 94  BUN 16 15  CREATININE 0.97 0.90  CALCIUM 8.8* 9.0   GFR: Estimated Creatinine Clearance: 87.8 mL/min (by C-G formula based on SCr of 0.9 mg/dL). Liver Function Tests:  Recent Labs Lab 04/07/17 2202  AST 23  ALT 22  ALKPHOS 57  BILITOT 0.7  PROT 6.5  ALBUMIN 3.5   No results for input(s): LIPASE, AMYLASE in the last 168 hours. No results for input(s): AMMONIA in the last 168 hours. Coagulation Profile:  Recent Labs Lab 04/07/17 2202  04/08/17 0955  INR 0.95 0.98   Cardiac Enzymes:  Recent Labs Lab 04/07/17 2202 04/08/17 0328 04/08/17 0955  TROPONINI <0.03 <0.03 <0.03   BNP (last 3 results) No results for input(s): PROBNP in the last 8760 hours. HbA1C: No results for input(s): HGBA1C in the last 72 hours. CBG: No results for input(s): GLUCAP in the last 168 hours. Lipid Profile: No results for input(s): CHOL, HDL, LDLCALC, TRIG, CHOLHDL, LDLDIRECT in the last 72 hours. Thyroid Function Tests: No results for input(s): TSH, T4TOTAL, FREET4, T3FREE, THYROIDAB in the last 72 hours. Anemia Panel: No results for input(s): VITAMINB12, FOLATE, FERRITIN, TIBC, IRON, RETICCTPCT in the last 72 hours. Sepsis Labs: No results for input(s): PROCALCITON, LATICACIDVEN in the last 168 hours.  Recent Results (from the past 240 hour(s))  MRSA PCR Screening     Status: None   Collection Time: 04/07/17  8:37 PM  Result Value Ref Range Status   MRSA by PCR NEGATIVE NEGATIVE Final    Comment:        The GeneXpert MRSA Assay (FDA approved for NASAL specimens only), is one component of a comprehensive MRSA colonization surveillance program. It is not intended to diagnose MRSA infection nor to guide or monitor treatment for MRSA infections.     Radiology Studies: No results found.  Scheduled Meds: . aspirin EC  81 mg Oral Daily  . atorvastatin  40 mg Oral q1800  . clopidogrel  75 mg Oral Daily  . metoprolol succinate  100 mg Oral Daily  . pantoprazole  40 mg Oral BID  . sodium chloride flush  3 mL Intravenous Q12H   Continuous Infusions: . sodium chloride    . sodium chloride 1 mL/kg/hr (04/08/17 1400)    LOS: 1 day   Merlene Laughter, DO [facility] Pager [phone]  If 7PM-7AM, please contact night-coverage [email or web address] Password [facility] 04/08/2017, 4:47 PM

## 2017-04-09 DIAGNOSIS — J8410 Pulmonary fibrosis, unspecified: Secondary | ICD-10-CM

## 2017-04-09 DIAGNOSIS — R590 Localized enlarged lymph nodes: Secondary | ICD-10-CM

## 2017-04-09 LAB — COMPREHENSIVE METABOLIC PANEL
ALT: 23 U/L (ref 17–63)
ANION GAP: 9 (ref 5–15)
AST: 22 U/L (ref 15–41)
Albumin: 3.4 g/dL — ABNORMAL LOW (ref 3.5–5.0)
Alkaline Phosphatase: 60 U/L (ref 38–126)
BILIRUBIN TOTAL: 0.9 mg/dL (ref 0.3–1.2)
BUN: 15 mg/dL (ref 6–20)
CHLORIDE: 105 mmol/L (ref 101–111)
CO2: 26 mmol/L (ref 22–32)
Calcium: 9 mg/dL (ref 8.9–10.3)
Creatinine, Ser: 0.94 mg/dL (ref 0.61–1.24)
GFR calc Af Amer: >60 mL/min (ref >60)
Glucose, Bld: 93 mg/dL (ref 65–99)
POTASSIUM: 3.8 mmol/L (ref 3.5–5.1)
Sodium: 140 mmol/L (ref 135–145)
TOTAL PROTEIN: 6.8 g/dL (ref 6.5–8.1)

## 2017-04-09 LAB — CBC WITH DIFFERENTIAL/PLATELET
BASOS PCT: 0 %
Basophils Absolute: 0 10*3/uL (ref 0.0–0.1)
EOS PCT: 2 %
Eosinophils Absolute: 0.1 10*3/uL (ref 0.0–0.7)
HEMATOCRIT: 37.9 % — AB (ref 39.0–52.0)
Hemoglobin: 13.2 g/dL (ref 13.0–17.0)
LYMPHS PCT: 27 %
Lymphs Abs: 1.4 10*3/uL (ref 0.7–4.0)
MCH: 31.8 pg (ref 26.0–34.0)
MCHC: 34.8 g/dL (ref 30.0–36.0)
MCV: 91.3 fL (ref 78.0–100.0)
MONO ABS: 0.6 10*3/uL (ref 0.1–1.0)
MONOS PCT: 11 %
NEUTROS ABS: 3.2 10*3/uL (ref 1.7–7.7)
Neutrophils Relative %: 60 %
PLATELETS: 129 10*3/uL — AB (ref 150–400)
RBC: 4.15 MIL/uL — ABNORMAL LOW (ref 4.22–5.81)
RDW: 12.4 % (ref 11.5–15.5)
WBC: 5.2 10*3/uL (ref 4.0–10.5)

## 2017-04-09 LAB — MAGNESIUM: MAGNESIUM: 1.9 mg/dL (ref 1.7–2.4)

## 2017-04-09 LAB — PHOSPHORUS: PHOSPHORUS: 3.8 mg/dL (ref 2.5–4.6)

## 2017-04-09 NOTE — Progress Notes (Signed)
Patient refuses assessment at this time.

## 2017-04-09 NOTE — Progress Notes (Signed)
Ethan White got really agitated this morning. Patient pulls leads off, takes the gown off and refuses to stay in the hospital. [name], NP is notified about current situation. Correction officer is in the room and tries to get in touch with his office. Patient is educated about the benefits of medical treatment and refuses to listen. Patient encouraged not to leave against medical advice. Continue to monitor and work with the patient.

## 2017-04-09 NOTE — Progress Notes (Addendum)
Patient requests to leave AMA RN discussed the risk to patient leaving against medical advice, pt. acknowledged understanding.  Patient signed AMA paperwork.  Dr. Marland Mcalpine notified.  Officer at bedside currently awaiting transport back to jail.

## 2017-04-09 NOTE — Discharge Summary (Signed)
Physician Discharge Summary  Ethan White ZOX:[id number] DOB: [birth year] DOA: 04/07/2017  PCP: No primary care provider on file.  Admit date: 04/07/2017 Discharge date: 04/09/2017  Admitted From: Prison via Transfer from [facility] Disposition:  Prison, Patient signed out [facility]  Recommendations for Outpatient Follow-up:  1. Follow up with PCP in 1-2 weeks 2. Have PCP complete Non-Cardiac Chest Pain workup 3. Follow up with Pulmonology to have Old calcified granuloma right lower lobe with calcified hilar and mediastinal lymph nodes evaluated (? Sarcoid; TB) 4. Please obtain CMP/CBC, Mag, Phos in one week 5. Follow up on ACE level and Quantiferon Tb Gold Assay and Anemia Panel  Ordered (Patient refused Lab Draw and Signed out AMA)  Home Health: No Equipment/Devices: None   Discharge Condition: Guarded CODE STATUS: FULL CODE Diet recommendation: Heart Healthy Diet  Brief/Interim Summary: The patient is a 56 yo Caucasian male with a PMH of CAD s/p PCI, HTN, HLD, and Hx of Systolic CHF with a reduced EF who was transferred from [place] and admitted to Cardiology Service. Patient's CP initially began at rest and was substernal and on the upper side of his chest. There was intermittent clamminess and emesis. Patient was started on Ranolazine and transferred to [facility] and underwent Cardiac Catheterization which was unremarkable. Patient continued to have CP and Cardiology asked to Transfer Care for work up of Non-Cardiac Chest Pain. Of Note patient is currently incarcerated and Emergency planning/management officer is at bedside.  Patient was seen and evaluated and states that his CP is still a 7/10 and still has not gone away. Patient has neck surgery back in 2010 and stated that he had to learn to swallow again. Of note patient has lost 14 pounds within the last few months and has early satiety. TRH was asked to take over the Care. A Chest CT Scan and SLP Eval was ordered to evaluate. Patient however became  agitated and wanted to leave. Nursing tried to encourage him to stay to complete workup for Non-Cardiac Chest Pain but patient became combative and belligerent and Nursing Paged that patient threatened to sign out. Patient then proceeded to signout Against Medical Advice understanding worsening risk of decompensation and being of sound mind patient signed out. He will be going back to prison and will need to have Non-Cardiac Chest Pain workup complete as an outpatient and need to see Gastroenterology and Pulmonary in the outpatient setting.   Discharge Diagnoses:  Active Problems:   Chest pain   CAD (coronary artery disease)   Benign essential HTN   HLD (hyperlipidemia)   Chronic systolic CHF (congestive heart failure) (HCC)   Normocytic anemia   Thrombocytopenia (HCC)  Non-Cardiac Chest Pain/Chest Wall Pain ?GERD vs. Visceral Hypersensitivity/ Esophageal Motility Disorder vs. Stress vs. Other Etiology -Increased Protonix to 40 BID -Patient is Allergic to Ketorolac so will avoid NSAIDs -Troponin Negative, EKG Unremarkable, and Cath showed single vessel obstructive CAD. There is a long 99% stenosis in a very small caliber OM2 vessel. This appears to be chronic and the vessel is only 1 mm in diameter. Continued patency of stents in the LAD, diagonal, and RCA. Mild LV dysfunction with EF 45-50%. Distal anterior akinesis.  Normal LVEDP -Will obtain Chest CT w/o Contrast to evaluate pulmonary causes of Non-Cardiac Chest Pain given recent Cath; No imaging studies were done on admission -Chest CT Showed No pleural effusion or other chest wall lesion. Old calcified granuloma right lower lobe with calcified hilar and mediastinal lymph nodes. No acute nodal  pathology.Aortic Atherosclerosis (ICD10-I70.0). Coronary artery atherosclerosis. -Ordered ACE level and Quantiferon Gold to evaluate old Calcified Granuloma and Calcified Hilar and Mediastinal Lymph Nodes but never drawn as patient signed out AMA -C/w  IVF -Obtain SLP -HIV Negative  -Will need to Discuss with Gastroenterology for potential EGD given early satiety, anemia, and weight loss but patient signed out AMA -Outpatient Workup to be completed as patient signed out AMA; Follow up with Pulmonary and Gastroenterology   CAD  -Troponin Negative -ECHO as below  -Cath Negative and Unremarkable  -Cardiology s/o -C/w ASA 81 mg, Atorvastatin 40 mg, Clopidogrel 75 mg po daily, Metoprolol 100 mg po Daily, NTG 0.4 mg  -Was not on ACE-I but will likely need to be on one  -Follow up with Cardiology as an outpatient as patient signed out AMA  HTN -C/w Metoprolol 100 mg po Daily -Patient Signed out AMA  HLD -C/w Atorvastatin 40 mg po qHS -Patient signed out AMA  Chronic Systolic CHF with EF of 45-50% on Cath and 35-40% on ECHO -Currently Compensated -Repeat ECHOCardiogram as below -C/w Metoprolol 100 mg po Daily  -Follow up with Cardiology as an outpatient as patient signed out AMA  Chronic Back Pain S/p Spinal Fusion -C/w Home Acetaminophen and Tramadol  Normocytic Anemia -Hb/Hct went from 12.5/36.2 -> 12.8/36.5 -> 13.2/37.9 -Check Anemia Panel; ordered and Pending -Repeat CBC as an outpatient as patient signed out Against Medical Advice  Thrombocytopenia -Patient's Platelet Count of 128 went to 129 -Repeat CBC as an outpatient have Thrombocytopenia worked up there as patient signed out [facility]  Discharge Instructions  Allergies as of 04/09/2017      Reactions   Ketorolac Hives      Medication List    TAKE these medications   acetaminophen 325 MG tablet Commonly known as:  TYLENOL Take 650 mg by mouth 3 (three) times daily as needed for moderate pain or headache.   aspirin EC 81 MG tablet Take 81 mg by mouth daily.   atorvastatin 40 MG tablet Commonly known as:  LIPITOR Take 40 mg by mouth at bedtime.   clopidogrel 75 MG tablet Commonly known as:  PLAVIX Take 75 mg by mouth daily.   metoprolol succinate 100  MG 24 hr tablet Commonly known as:  TOPROL-XL Take 100 mg by mouth 2 (two) times daily. Take with or immediately following a meal.   nitroGLYCERIN 0.4 MG SL tablet Commonly known as:  NITROSTAT Place 0.4 mg under the tongue every 5 (five) minutes as needed for chest pain.   pantoprazole 40 MG tablet Commonly known as:  PROTONIX Take 40 mg by mouth daily.   traMADol 50 MG tablet Commonly known as:  ULTRAM Take 100 mg by mouth every 8 (eight) hours.       Allergies  Allergen Reactions  . Ketorolac Hives   Consultations:  Transfer from Cardiology Service  Procedures/Studies: Ct Chest Wo Contrast  Result Date: 04/08/2017 CLINICAL DATA:  Chest pain and shortness of breath. Pleurisy or E fusion suspected. EXAM: CT CHEST WITHOUT CONTRAST TECHNIQUE: Multidetector CT imaging of the chest was performed following the standard protocol without IV contrast. COMPARISON:  None. FINDINGS: Cardiovascular: Noncontrast examination. There is atherosclerosis of the aorta in the proximal brachiocephalic vessels. There is coronary artery calcification and/or stents. Heart size is normal. Small amount of pericardial fluid. Mediastinum/Nodes: Small mediastinal lymph nodes with calcification. Bilateral hilar nodes with calcification. No enlarged nodes. See below. Lungs/Pleura: Densely calcified granuloma in the right lower lobe image 94. No  other pulmonary parenchymal finding. No pleural fluid. Upper Abdomen: Aortic atherosclerosis. Contrast in nondilated renal collecting systems. Musculoskeletal: Negative. 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 IMPRESSION: No pleural effusion or other chest wall lesion. Old calcified granuloma right lower lobe with calcified hilar and mediastinal lymph nodes. No acute nodal pathology. Aortic Atherosclerosis (ICD10-I70.0). Coronary artery atherosclerosis. Electronically Signed   By: Paulina Fusi M.D.   On: 04/08/2017 18:35   CARDIAC CATH  Mid LAD to  Dist LAD lesion, 0 %stenosed.  Ost 2nd Diag to 2nd Diag lesion, 0 %stenosed.  Ost 2nd Diag lesion, 50 %stenosed.  Prox RCA to Mid RCA lesion, 0 %stenosed.  Prox RCA lesion, 30 %stenosed.  Ost 2nd Mrg to 2nd Mrg lesion, 99 %stenosed.  There is mild left ventricular systolic dysfunction.  LV end diastolic pressure is normal.  The left ventricular ejection fraction is 45-50% by visual estimate.  1. Single vessel obstructive CAD. There is a long 99% stenosis in a very small caliber OM2 vessel. This appears to be chronic and the vessel is only 1 mm in diameter. Continued patency of stents in the LAD, diagonal, and RCA. 2. Mild LV dysfunction with EF 45-50%. Distal anterior akinesis 3. Normal LVEDP  ECHOCARDIOGRAM Study Conclusions  - Left ventricle: The cavity size was mildly dilated. Systolic function was moderately reduced. The estimated ejection fraction was in the range of 35% to 40%. Diffuse hypokinesis. Doppler parameters are consistent with abnormal left ventricular relaxation (grade 1 diastolic dysfunction). Doppler parameters are consistent with indeterminate ventricular filling pressure. - Aortic valve: Transvalvular velocity was within the normal range. There was no stenosis. There was no regurgitation. - Mitral valve: Transvalvular velocity was within the normal range. There was no evidence for stenosis. There was mild regurgitation. - Right ventricle: The cavity size was normal. Wall thickness was normal. Systolic function was normal. - Tricuspid valve: There was trivial regurgitation. - Pulmonary arteries: PA peak pressure: 24 mm Hg (S).  Subjective: Patient became agitated and belligerent this AM and wanted to sign out. He removed his leads and took off Gown. Nursing paged that patient did not want to stay to complete work up so signed out AMA without being seen. He will be Discharged back to prison and will need follow up.  Discharge  Exam: Vitals:   04/08/17 2329 04/09/17 0401  BP: 123/76 110/61  Pulse: 62 66  Resp: 15 15  Temp: 97.8 F (36.6 C) 97.8 F (36.6 C)  SpO2: 99% 98%   Vitals:   04/08/17 1540 04/08/17 1938 04/08/17 2329 04/09/17 0401  BP: 124/79 119/60 123/76 110/61  Pulse: (!) 49 83 62 66  Resp: 14 16 15 15   Temp: 97.9 F (36.6 C) 98 F (36.7 C) 97.8 F (36.6 C) 97.8 F (36.6 C)  TempSrc: Oral Oral Oral Oral  SpO2: 96% 97% 99% 98%  Weight:      Height:       UNABLE TO EXAMINE PATIENT AS PATIENT SIGNED OUT AMA PRIOR TO ME EVALUATING THE PATIENT  The results of significant diagnostics from this hospitalization (including imaging, microbiology, ancillary and laboratory) are listed below for reference.    Microbiology: Recent Results (from the past 240 hour(s))  MRSA PCR Screening     Status: None   Collection Time: 04/07/17  8:37 PM  Result Value Ref Range Status   MRSA by PCR NEGATIVE NEGATIVE Final    Comment:        The GeneXpert MRSA Assay (  FDA approved for NASAL specimens only), is one component of a comprehensive MRSA colonization surveillance program. It is not intended to diagnose MRSA infection nor to guide or monitor treatment for MRSA infections.     Labs: BNP (last 3 results) No results for input(s): BNP in the last 8760 hours. Basic Metabolic Panel:  Recent Labs Lab 04/07/17 2202 04/08/17 0955 04/09/17 0410  NA 137 138 140  K 4.0 4.3 3.8  CL 104 105 105  CO2 26 25 26   GLUCOSE 93 94 93  BUN 16 15 15   CREATININE 0.97 0.90 0.94  CALCIUM 8.8* 9.0 9.0  MG  --   --  1.9  PHOS  --   --  3.8   Liver Function Tests:  Recent Labs Lab 04/07/17 2202 04/09/17 0410  AST 23 22  ALT 22 23  ALKPHOS 57 60  BILITOT 0.7 0.9  PROT 6.5 6.8  ALBUMIN 3.5 3.4*   No results for input(s): LIPASE, AMYLASE in the last 168 hours. No results for input(s): AMMONIA in the last 168 hours. CBC:  Recent Labs Lab 04/07/17 2202 04/08/17 0955 04/09/17 0410  WBC 4.8 4.0 5.2   NEUTROABS 2.3  --  3.2  HGB 12.5* 12.8* 13.2  HCT 36.2* 36.5* 37.9*  MCV 91.9 91.5 91.3  PLT 128* 128* 129*   Cardiac Enzymes:  Recent Labs Lab 04/07/17 2202 04/08/17 0328 04/08/17 0955  TROPONINI <0.03 <0.03 <0.03   BNP: Invalid input(s): POCBNP CBG: No results for input(s): GLUCAP in the last 168 hours. D-Dimer No results for input(s): DDIMER in the last 72 hours. Hgb A1c No results for input(s): HGBA1C in the last 72 hours. Lipid Profile No results for input(s): CHOL, HDL, LDLCALC, TRIG, CHOLHDL, LDLDIRECT in the last 72 hours. Thyroid function studies No results for input(s): TSH, T4TOTAL, T3FREE, THYROIDAB in the last 72 hours.  Invalid input(s): FREET3 Anemia work up No results for input(s): VITAMINB12, FOLATE, FERRITIN, TIBC, IRON, RETICCTPCT in the last 72 hours. Urinalysis    Component Value Date/Time   COLORURINE Yellow 03/16/2013 0127   APPEARANCEUR Clear 03/16/2013 0127   LABSPEC 1.008 03/16/2013 0127   PHURINE 8.0 03/16/2013 0127   GLUCOSEU Negative 03/16/2013 0127   HGBUR Negative 03/16/2013 0127   BILIRUBINUR Negative 03/16/2013 0127   KETONESUR Negative 03/16/2013 0127   PROTEINUR Negative 03/16/2013 0127   NITRITE Negative 03/16/2013 0127   LEUKOCYTESUR Negative 03/16/2013 0127   Sepsis Labs Invalid input(s): PROCALCITONIN,  WBC,  LACTICIDVEN Microbiology Recent Results (from the past 240 hour(s))  MRSA PCR Screening     Status: None   Collection Time: 04/07/17  8:37 PM  Result Value Ref Range Status   MRSA by PCR NEGATIVE NEGATIVE Final    Comment:        The GeneXpert MRSA Assay (FDA approved for NASAL specimens only), is one component of a comprehensive MRSA colonization surveillance program. It is not intended to diagnose MRSA infection nor to guide or monitor treatment for MRSA infections.    Time coordinating discharge: 25 minutes  SIGNED:  Merlene Laughter, DO [facility] 04/09/2017, 8:09 AM Pager  [phone]  If 7PM-7AM, please contact night-coverage [email or web address] Password [email or web address]

## 2017-04-09 NOTE — Progress Notes (Signed)
RN updated facility RN Madaline Brilliant at the jail regarding patients condition and patient leaving AMA.

## 2017-08-05 ENCOUNTER — Emergency Department (HOSPITAL_COMMUNITY)
Admission: EM | Admit: 2017-08-05 | Discharge: 2017-08-05 | Discharge status: Against Medical Advice | Attending: Emergency Medicine | Admitting: Emergency Medicine

## 2017-08-05 ENCOUNTER — Emergency Department (HOSPITAL_COMMUNITY)

## 2017-08-05 ENCOUNTER — Encounter (HOSPITAL_COMMUNITY): Payer: Self-pay | Admitting: Oncology

## 2017-08-05 DIAGNOSIS — R079 Chest pain, unspecified: Secondary | ICD-10-CM

## 2017-08-05 DIAGNOSIS — Z79899 Other long term (current) drug therapy: Secondary | ICD-10-CM | POA: Insufficient documentation

## 2017-08-05 DIAGNOSIS — Z87891 Personal history of nicotine dependence: Secondary | ICD-10-CM | POA: Insufficient documentation

## 2017-08-05 DIAGNOSIS — I5022 Chronic systolic (congestive) heart failure: Secondary | ICD-10-CM | POA: Insufficient documentation

## 2017-08-05 DIAGNOSIS — Z5329 Procedure and treatment not carried out because of patient's decision for other reasons: Secondary | ICD-10-CM | POA: Insufficient documentation

## 2017-08-05 DIAGNOSIS — R110 Nausea: Secondary | ICD-10-CM | POA: Insufficient documentation

## 2017-08-05 DIAGNOSIS — R61 Generalized hyperhidrosis: Secondary | ICD-10-CM | POA: Insufficient documentation

## 2017-08-05 DIAGNOSIS — Z7902 Long term (current) use of antithrombotics/antiplatelets: Secondary | ICD-10-CM | POA: Insufficient documentation

## 2017-08-05 DIAGNOSIS — I110 Hypertensive heart disease with heart failure: Secondary | ICD-10-CM | POA: Insufficient documentation

## 2017-08-05 DIAGNOSIS — I2510 Atherosclerotic heart disease of native coronary artery without angina pectoris: Secondary | ICD-10-CM | POA: Insufficient documentation

## 2017-08-05 DIAGNOSIS — Z955 Presence of coronary angioplasty implant and graft: Secondary | ICD-10-CM | POA: Insufficient documentation

## 2017-08-05 DIAGNOSIS — Z7982 Long term (current) use of aspirin: Secondary | ICD-10-CM | POA: Insufficient documentation

## 2017-08-05 HISTORY — DX: Acute myocardial infarction, unspecified: I21.9

## 2017-08-05 HISTORY — DX: Essential (primary) hypertension: I10

## 2017-08-05 LAB — CBC WITH DIFFERENTIAL/PLATELET
BASOS ABS: 0 10*3/uL (ref 0.0–0.1)
BASOS PCT: 0 %
Eosinophils Absolute: 0.1 10*3/uL (ref 0.0–0.7)
Eosinophils Relative: 2 %
HEMATOCRIT: 35.6 % — AB (ref 39.0–52.0)
HEMOGLOBIN: 12.1 g/dL — AB (ref 13.0–17.0)
Lymphocytes Relative: 40 %
Lymphs Abs: 1.6 10*3/uL (ref 0.7–4.0)
MCH: 31.3 pg (ref 26.0–34.0)
MCHC: 34 g/dL (ref 30.0–36.0)
MCV: 92.2 fL (ref 78.0–100.0)
Monocytes Absolute: 0.4 10*3/uL (ref 0.1–1.0)
Monocytes Relative: 11 %
NEUTROS ABS: 1.9 10*3/uL (ref 1.7–7.7)
NEUTROS PCT: 47 %
Platelets: 169 10*3/uL (ref 150–400)
RBC: 3.86 MIL/uL — ABNORMAL LOW (ref 4.22–5.81)
RDW: 12.4 % (ref 11.5–15.5)
WBC: 4.1 10*3/uL (ref 4.0–10.5)

## 2017-08-05 LAB — BASIC METABOLIC PANEL
ANION GAP: 8 (ref 5–15)
BUN: 16 mg/dL (ref 6–20)
CALCIUM: 8.7 mg/dL — AB (ref 8.9–10.3)
CHLORIDE: 109 mmol/L (ref 101–111)
CO2: 22 mmol/L (ref 22–32)
Creatinine, Ser: 0.87 mg/dL (ref 0.61–1.24)
GFR calc non Af Amer: >60 mL/min (ref >60)
Glucose, Bld: 92 mg/dL (ref 65–99)
Potassium: 4 mmol/L (ref 3.5–5.1)
Sodium: 139 mmol/L (ref 135–145)

## 2017-08-05 LAB — BRAIN NATRIURETIC PEPTIDE: B Natriuretic Peptide: 39.2 pg/mL (ref 0.0–100.0)

## 2017-08-05 LAB — I-STAT TROPONIN, ED: Troponin i, poc: 0 ng/mL (ref 0.00–0.08)

## 2017-08-05 MED ORDER — NITROGLYCERIN 0.4 MG SL SUBL: 0.4000 mg | SUBLINGUAL_TABLET | SUBLINGUAL | Status: DC | PRN

## 2017-08-05 NOTE — ED Triage Notes (Addendum)
Pt bib [facility] d/t central CP that radiates to left arm.  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  Reported to EMS that this feels similar.  Pt's pain began at 1730. Pt given 324 mg ASA, 0.4 nitro x 3 en route all w/o relief.  Pt is in police custody.

## 2017-08-05 NOTE — ED Notes (Signed)
Pt cursing this Clinical research associatewriter stating, "I'm not just gonna fucking lay here in pain."  Pt stated he was leaving.  Removed monitoring equipment.  Attempted to explain the risks of leaving AMA to which pt responded, "You're wasting your breath I'm not listening to you."  Pt refused to sign AMA papers.  Will inform MD.

## 2017-08-05 NOTE — ED Notes (Signed)
Patient transported to X-ray 

## 2017-08-05 NOTE — ED Provider Notes (Signed)
[facility] EMERGENCY DEPARTMENT Provider Note   CSN: [id number] Arrival date & time: 08/05/17  1857     History   Chief Complaint Chief Complaint  Patient presents with  . Chest Pain    HPI Ethan White is a 56 y.o. male.  The history is provided by the patient and the EMS personnel.  Chest Pain   This is a new problem. The current episode started 1 to 2 hours ago. The problem occurs constantly. The problem has not changed since onset.The pain is associated with rest. The pain is present in the substernal region. The pain is severe. The quality of the pain is described as pressure-like and heavy. The pain radiates to the left arm. Duration of episode(s) is 2 hours. Associated symptoms include diaphoresis and nausea. Pertinent negatives include no abdominal pain, no back pain, no cough, no exertional chest pressure, no fever, no hemoptysis, no leg pain, no lower extremity edema, no orthopnea, no palpitations, no shortness of breath, no vomiting and no weakness. He has tried nitroglycerin for the symptoms. The treatment provided no relief.  His past medical history is significant for CAD, congenital heart disease and hypertension.  Pertinent negatives for past medical history include no seizures.  Patient presenting from jail for sudden onset chest pain at 5:30 PM.  States is similar to his prior cardiac event.  He was given 324 of aspirin and 3 sublingual nitros by EMS with no relief and no change in symptoms.   Past Medical History:  Diagnosis Date  . CHF (congestive heart failure) (HCC)   . Coronary artery disease 04/08/2017   single vessel  . Hypertension   . MI (myocardial infarction) [facility])    x2     Patient Active Problem List   Diagnosis Date Noted  . Chest pain 04/08/2017  . CAD (coronary artery disease) 04/08/2017  . Benign essential HTN 04/08/2017  . HLD (hyperlipidemia) 04/08/2017  . Chronic systolic CHF (congestive heart failure) (HCC)  04/08/2017  . Normocytic anemia 04/08/2017  . Thrombocytopenia (HCC) 04/08/2017  . Unstable angina (HCC) 04/07/2017    Past Surgical History:  Procedure Laterality Date  . LEFT HEART CATH AND CORONARY ANGIOGRAPHY N/A 04/08/2017   Procedure: LEFT HEART CATH AND CORONARY ANGIOGRAPHY;  Surgeon: [place], Peter M, MD;  Location: [facility] INVASIVE CV LAB;  Service: Cardiovascular;  Laterality: N/A;       Home Medications    Prior to Admission medications   Medication Sig Start Date End Date Taking? Authorizing Provider  acetaminophen (TYLENOL) 325 MG tablet Take 650 mg by mouth 3 (three) times daily as needed for moderate pain or headache.     [provider]  aspirin EC 81 MG tablet Take 81 mg by mouth daily.     [provider]  atorvastatin (LIPITOR) 40 MG tablet Take 40 mg by mouth at bedtime.     [provider]  clopidogrel (PLAVIX) 75 MG tablet Take 75 mg by mouth daily.  12/12/15   [provider]  metoprolol succinate (TOPROL-XL) 100 MG 24 hr tablet Take 100 mg by mouth 2 (two) times daily. Take with or immediately following a meal.    [provider]  nitroGLYCERIN (NITROSTAT) 0.4 MG SL tablet Place 0.4 mg under the tongue every 5 (five) minutes as needed for chest pain.     [provider]  pantoprazole (PROTONIX) 40 MG tablet Take 40 mg by mouth daily.    [provider]  traMADol (  ULTRAM) 50 MG tablet Take 100 mg by mouth every 8 (eight) hours.    [provider]    Family History No family history on file.  Social History Social History   Tobacco Use  . Smoking status: Former Games developermoker  . Smokeless tobacco: Never Used  Substance Use Topics  . Alcohol use: No    Frequency: Never  . Drug use: No     Allergies   Ketorolac   Review of Systems Review of Systems  Constitutional: Positive for diaphoresis. Negative for chills and fever.  HENT: Negative for ear pain and sore throat.   Eyes: Negative for  pain and visual disturbance.  Respiratory: Negative for cough, hemoptysis and shortness of breath.   Cardiovascular: Positive for chest pain. Negative for palpitations and orthopnea.  Gastrointestinal: Positive for nausea. Negative for abdominal pain and vomiting.  Genitourinary: Negative for dysuria and hematuria.  Musculoskeletal: Negative for arthralgias and back pain.  Skin: Negative for color change and rash.  Neurological: Negative for seizures, syncope and weakness.  All other systems reviewed and are negative.    Physical Exam Updated Vital Signs BP (!) 145/95   Pulse 82   Temp 98.8 F (37.1 C) (Oral)   Resp 18   Ht 6\' 2"  (1.88 m)   Wt 77.1 kg (170 lb)   SpO2 99%   BMI 21.83 kg/m   Physical Exam  Constitutional: He appears well-developed and well-nourished. He appears distressed.  HENT:  Head: Normocephalic and atraumatic.  Eyes: Conjunctivae and EOM are normal. Pupils are equal, round, and reactive to light.  Neck: Normal range of motion. Neck supple.  Cardiovascular: Normal rate, regular rhythm and normal pulses.  No murmur heard. Pulmonary/Chest: Effort normal and breath sounds normal. No respiratory distress. He has no decreased breath sounds. He has no rales.  Abdominal: Soft. There is no tenderness.  Musculoskeletal: He exhibits no edema.       Right lower leg: He exhibits no tenderness and no edema.       Left lower leg: He exhibits no tenderness and no edema.  Neurological: He is alert.  Skin: Skin is warm and dry. Capillary refill takes less than 2 seconds.  Psychiatric: He has a normal mood and affect.  Nursing note and vitals reviewed.    ED Treatments / Results  Labs (all labs ordered are listed, but only abnormal results are displayed) Labs Reviewed  CBC WITH DIFFERENTIAL/PLATELET - Abnormal; Notable for the following components:      Result Value   RBC 3.86 (*)    Hemoglobin 12.1 (*)    HCT 35.6 (*)    All other components within normal  limits  BASIC METABOLIC PANEL - Abnormal; Notable for the following components:   Calcium 8.7 (*)    All other components within normal limits  BRAIN NATRIURETIC PEPTIDE  I-STAT TROPONIN, ED  I-STAT TROPONIN, ED    EKG  EKG Interpretation  Date/Time:  Friday August 05 2017 19:06:26 EST Ventricular Rate:  86 PR Interval:    QRS Duration: 100 QT Interval:  386 QTC Calculation: 462 R Axis:   53 Text Interpretation:  Sinus rhythm Ventricular premature complex Probable anteroseptal infarct, recent No acute changes No significant change since last tracing Nonspecific ST abnormality - v1-v3 without any recirocal changes Confirmed by Derwood Kaplananavati, Ankit ([id number]) on 08/05/2017 7:25:58 PM       Radiology Dg Chest 1 View  Result Date: 08/05/2017 CLINICAL DATA:  Left-sided chest pain today extending into the  left arm. EXAM: CHEST 1 VIEW COMPARISON:  04/08/2017 chest CT FINDINGS: The cardiomediastinal silhouette is within normal limits. Prior granulomatous disease is again noted with a right lung calcified nodule and small calcified hilar lymph nodes. No airspace consolidation, edema, pleural effusion, pneumothorax is identified. Mild thoracic spondylosis and prior cervical fusion are noted. IMPRESSION: Prior granulomatous disease without evidence of acute cardiopulmonary process. Electronically Signed   By: Sebastian AcheAllen  Grady M.D.   On: 08/05/2017 19:39    Procedures Procedures (including critical care time)  Medications Ordered in ED Medications  nitroGLYCERIN (NITROSTAT) SL tablet 0.4 mg (not administered)     Initial Impression / Assessment and Plan / ED Course  I have reviewed the triage vital signs and the nursing notes.  Pertinent labs & imaging results that were available during my care of the patient were reviewed by me and considered in my medical decision making (see chart for details).     56 year old male with a history of CAD status post stents, CHF and hypertension presenting  with sudden onset chest pain at 530.  Brought in from jail.  States his pain is similar to his prior MI.  He is afebrile and hemodynamic is stable.  Pulses equal bilaterally.  Equal breath sounds bilaterally.  He was given aspirin by EMS.  EKG with nonspecific ST changes in anterior leads but no acute ischemic changes when compared to prior EKGs and has no reciprocal changes.  We will try 1 more nitro and it helps his pain, will plan to start nitro drip.  CBC, BMP, BNP, troponin and chest x-ray ordered.  Chest x-ray with no acute abnormality such as pneumothorax, signs of pneumonia, cardiomegaly or widened mediastinum. No signs of DVT on exam, is not tachycardic or hypoxic, low suspicion for PE.  No widened mediastinum, pain does not radiate to back, equal pulses bilaterally therefore lower suspicion for dissection.  Prior to the labs coming back the patient left AMA.  Risk discussed by nursing staff and patient agreed and signed AMA paperwork.  Troponin is negative.  Final Clinical Impressions(s) / ED Diagnoses   Final diagnoses:  Chest pain, unspecified type    ED Discharge Orders    None       Tanner Vigna Italyhad, MD 08/05/17 [phone]    Derwood KaplanNanavati, Ankit, MD 08/05/17 2357

## 2020-03-02 ENCOUNTER — Emergency Department
Admission: EM | Admit: 2020-03-02 | Discharge: 2020-03-02 | Discharge status: Against Medical Advice | Payer: Medicaid Other | Attending: Emergency Medicine | Admitting: Emergency Medicine

## 2020-03-02 ENCOUNTER — Emergency Department: Payer: Medicaid Other

## 2020-03-02 ENCOUNTER — Other Ambulatory Visit: Payer: Self-pay

## 2020-03-02 DIAGNOSIS — Z87891 Personal history of nicotine dependence: Secondary | ICD-10-CM | POA: Insufficient documentation

## 2020-03-02 DIAGNOSIS — I5022 Chronic systolic (congestive) heart failure: Secondary | ICD-10-CM | POA: Insufficient documentation

## 2020-03-02 DIAGNOSIS — I110 Hypertensive heart disease with heart failure: Secondary | ICD-10-CM | POA: Insufficient documentation

## 2020-03-02 DIAGNOSIS — R079 Chest pain, unspecified: Secondary | ICD-10-CM

## 2020-03-02 DIAGNOSIS — I25110 Atherosclerotic heart disease of native coronary artery with unstable angina pectoris: Secondary | ICD-10-CM | POA: Insufficient documentation

## 2020-03-02 DIAGNOSIS — R0789 Other chest pain: Secondary | ICD-10-CM | POA: Diagnosis not present

## 2020-03-02 DIAGNOSIS — Z765 Malingerer [conscious simulation]: Secondary | ICD-10-CM

## 2020-03-02 LAB — BASIC METABOLIC PANEL
Anion gap: 8 (ref 5–15)
BUN: 26 mg/dL — ABNORMAL HIGH (ref 8–23)
CO2: 24 mmol/L (ref 22–32)
Calcium: 8.3 mg/dL — ABNORMAL LOW (ref 8.9–10.3)
Chloride: 111 mmol/L (ref 98–111)
Creatinine, Ser: 0.94 mg/dL (ref 0.61–1.24)
GFR calc Af Amer: >60 mL/min (ref >60)
GFR calc non Af Amer: >60 mL/min (ref >60)
Glucose, Bld: 82 mg/dL (ref 70–99)
Potassium: 4.1 mmol/L (ref 3.5–5.1)
Sodium: 143 mmol/L (ref 135–145)

## 2020-03-02 LAB — CBC
HCT: 29.5 % — ABNORMAL LOW (ref 39.0–52.0)
Hemoglobin: 10.1 g/dL — ABNORMAL LOW (ref 13.0–17.0)
MCH: 33.1 pg (ref 26.0–34.0)
MCHC: 34.2 g/dL (ref 30.0–36.0)
MCV: 96.7 fL (ref 80.0–100.0)
Platelets: 189 10*3/uL (ref 150–400)
RBC: 3.05 MIL/uL — ABNORMAL LOW (ref 4.22–5.81)
RDW: 15.2 % (ref 11.5–15.5)
WBC: 6.1 10*3/uL (ref 4.0–10.5)
nRBC: 0 % (ref 0.0–0.2)

## 2020-03-02 LAB — TROPONIN I (HIGH SENSITIVITY): Troponin I (High Sensitivity): 16 ng/L (ref <18)

## 2020-03-02 MED ORDER — HYDROMORPHONE HCL 1 MG/ML IJ SOLN
0.5000 mg | Freq: Once | INTRAMUSCULAR | Status: AC
  Administered 2020-03-02: 0.5 mg via INTRAVENOUS
  Filled 2020-03-02: qty 1

## 2020-03-02 MED ORDER — ONDANSETRON HCL 4 MG/2ML IJ SOLN
4.0000 mg | Freq: Once | INTRAMUSCULAR | Status: AC
  Administered 2020-03-02: 4 mg via INTRAVENOUS
  Filled 2020-03-02: qty 2

## 2020-03-02 MED ORDER — OXYCODONE HCL 5 MG PO TABS
10.0000 mg | ORAL_TABLET | ORAL | Status: AC
  Administered 2020-03-02: 10 mg via ORAL
  Filled 2020-03-02: qty 2

## 2020-03-02 MED ORDER — SODIUM CHLORIDE 0.9% FLUSH: 3.0000 mL | Freq: Once | INTRAVENOUS | Status: DC

## 2020-03-02 MED ORDER — CLONAZEPAM 0.5 MG PO TABS: 2.0000 mg | ORAL_TABLET | Freq: Three times a day (TID) | ORAL | Status: DC | PRN

## 2020-03-02 MED ORDER — FENTANYL CITRATE (PF) 100 MCG/2ML IJ SOLN
12.5000 ug | Freq: Once | INTRAMUSCULAR | Status: AC
  Administered 2020-03-02: 12.5 ug via INTRAVENOUS
  Filled 2020-03-02: qty 2

## 2020-03-02 MED ORDER — CLONAZEPAM 0.5 MG PO TABS
2.0000 mg | ORAL_TABLET | Freq: Once | ORAL | Status: AC
  Administered 2020-03-02: 2 mg via ORAL
  Filled 2020-03-02: qty 4

## 2020-03-02 MED ORDER — FENTANYL CITRATE (PF) 100 MCG/2ML IJ SOLN
12.5000 ug | Freq: Once | INTRAMUSCULAR | Status: AC
Start: 2020-03-02 — End: 2020-03-02
  Administered 2020-03-02: 12.5 ug via INTRAVENOUS
  Filled 2020-03-02: qty 2

## 2020-03-02 NOTE — ED Triage Notes (Signed)
[place] [facility] [place] for reports of central chest pain that started around 1430. Pt states he was walking and started having CP that radiated down the left arm. Pt A&Ox4, skin warm and dry. Per EMS pt refused ibuprofen for pain and states he wanted the "good stuff". 324 ASA given by EMS and 1 in nitro paste to left chest. PT reports he had an MI in 2008 and the pain feels the same.

## 2020-03-02 NOTE — ED Notes (Addendum)
Pt asking for this RN to call [facility] to let a family member know that he is here, gave pt cordless phone and provided with number to [facility] so he could call them. PT asking for crackers to eat, advised pt he could not eat or drink anything until his lab results came back. Pt voices understanding.

## 2020-03-02 NOTE — ED Notes (Signed)
Recollected light green tube and sent to lab

## 2020-03-02 NOTE — ED Notes (Signed)
Verbal order given by Dr. Darnelle Catalan for 0.5mg  dilaudid IV stat

## 2020-03-02 NOTE — ED Notes (Signed)
Pt states pain is back at a 9, pt reports fentanyl helped "a little bit". Dr. Darnelle Catalan informed

## 2020-03-02 NOTE — ED Provider Notes (Signed)
[facility] Emergency Department Provider Note   ____________________________________________   None    (approximate)  I have reviewed the triage vital signs and the nursing notes.   HISTORY  Chief Complaint Chest Pain   HPI Ethan White is a 59 y.o. male who says he is having chest pain starting in his chest rating to the arm and jaw burning.  Feels like his previous heart attack.  He says he has 4 stents.  He is not nauseated.  Does not appear to be short of breath.  He says he is taken none of his medicines today because he was visiting a relative in [facility].  Pain started several hours ago.  It is moderately severe.  EMS gave him nitro and aspirin which she says did not help him at all.  EMS also reported he was asking for good pain medicine.        Past Medical History:  Diagnosis Date  . CHF (congestive heart failure) (HCC)   . Coronary artery disease 04/08/2017   single vessel  . Hypertension   . MI (myocardial infarction) [facility])    x2     Patient Active Problem List   Diagnosis Date Noted  . Chest pain 04/08/2017  . CAD (coronary artery disease) 04/08/2017  . Benign essential HTN 04/08/2017  . HLD (hyperlipidemia) 04/08/2017  . Chronic systolic CHF (congestive heart failure) (HCC) 04/08/2017  . Normocytic anemia 04/08/2017  . Thrombocytopenia (HCC) 04/08/2017  . Unstable angina (HCC) 04/07/2017    Past Surgical History:  Procedure Laterality Date  . LEFT HEART CATH AND CORONARY ANGIOGRAPHY N/A 04/08/2017   Procedure: LEFT HEART CATH AND CORONARY ANGIOGRAPHY;  Surgeon: [place], Peter M, MD;  Location: [facility] INVASIVE CV LAB;  Service: Cardiovascular;  Laterality: N/A;    Prior to Admission medications   Medication Sig Start Date End Date Taking? Authorizing Provider  acetaminophen (TYLENOL) 325 MG tablet Take 650 mg by mouth 3 (three) times daily as needed for moderate pain or headache.     [provider]  aspirin EC  81 MG tablet Take 81 mg by mouth daily.     [provider]  atorvastatin (LIPITOR) 40 MG tablet Take 40 mg by mouth at bedtime.     [provider]  clopidogrel (PLAVIX) 75 MG tablet Take 75 mg by mouth daily.  12/12/15   [provider]  metoprolol succinate (TOPROL-XL) 100 MG 24 hr tablet Take 100 mg by mouth 2 (two) times daily. Take with or immediately following a meal.    [provider]  nitroGLYCERIN (NITROSTAT) 0.4 MG SL tablet Place 0.4 mg under the tongue every 5 (five) minutes as needed for chest pain.     [provider]  pantoprazole (PROTONIX) 40 MG tablet Take 40 mg by mouth daily.    [provider]  traMADol (ULTRAM) 50 MG tablet Take 100 mg by mouth every 8 (eight) hours.    [provider]    Allergies Ketorolac, Tramadol, and Morphine and related  History reviewed. No pertinent family history.  Social History Social History   Tobacco Use  . Smoking status: Former Games developer  . Smokeless tobacco: Never Used  Vaping Use  . Vaping Use: Never used  Substance Use Topics  . Alcohol use: No  . Drug use: No    Review of Systems  Constitutional: No fever/chills Eyes: No visual changes. ENT: No sore throat. Cardiovascular chest pain. Respiratory: Denies shortness of breath.  Gastrointestinal: No abdominal pain.  No nausea, no vomiting.  No diarrhea.  No constipation. Genitourinary: Negative for dysuria. Musculoskeletal: Negative for back pain. Skin: Negative for rash. Neurological: Negative for headaches, focal weakness   ____________________________________________   PHYSICAL EXAM:  VITAL SIGNS: ED Triage Vitals [03/02/20 1449]  Enc Vitals Group     BP 123/70     Pulse Rate 94     Resp 15     Temp 98.7 F (37.1 C)     Temp Source Oral     SpO2 95 %     Weight 139 lb (63 kg)     Height 6\' 2"  (1.88 m)     Head Circumference      [facility] Flow      Pain Score 10     Pain Loc      Pain Edu?       Excl. in GC?     Constitutional: Alert and oriented.  Appears uncomfortable Eyes: Conjunctivae are normal.  Head: Atraumatic. Nose: No congestion/rhinnorhea. Mouth/Throat: Mucous membranes are moist.  Oropharynx non-erythematous. Neck: No stridor.  Cardiovascular: Normal rate, regular rhythm. Grossly normal heart sounds.  Good peripheral circulation. Respiratory: Normal respiratory effort.  No retractions. Lungs CTAB. Gastrointestinal: Soft and nontender. No distention. No abdominal bruits.  Musculoskeletal: No lower extremity tenderness nor edema.   Neurologic:  Normal speech and language. No gross focal neurologic deficits are appreciated.  Skin:  Skin is warm, dry and intact. No rash noted.   ____________________________________________   LABS (all labs ordered are listed, but only abnormal results are displayed)  Labs Reviewed  CBC - Abnormal; Notable for the following components:      Result Value   RBC 3.05 (*)    Hemoglobin 10.1 (*)    HCT 29.5 (*)    All other components within normal limits  BASIC METABOLIC PANEL - Abnormal; Notable for the following components:   BUN 26 (*)    Calcium 8.3 (*)    All other components within normal limits  TROPONIN I (HIGH SENSITIVITY)   ____________________________________________  EKG  EKG #1 read and interpreted by me shows normal sinus rhythm rate of 93 normal axis T flipped in V6 barely.  Irregular baseline  EKG #2 read interpreted by me shows normal sinus rhythm rate of 77 similar to EKG #1 except for that V6 is now having upward T wave by about a millimeter. ____________________________________________  RADIOLOGY    Official radiology report(s): DG Chest 2 View  Result Date: 03/02/2020 CLINICAL DATA:  Chest pain EXAM: CHEST - 2 VIEW COMPARISON:  November 03, 2018 FINDINGS: The heart size and mediastinal contours are within normal limits. Both lungs are clear. The visualized skeletal structures are unremarkable. Calcified  granulomas are noted. Atherosclerotic changes are noted of the thoracic aorta. IMPRESSION: No active cardiopulmonary disease. Electronically Signed   By: November 05, 2018 M.D.   On: 03/02/2020 15:16    ____________________________________________   PROCEDURES  Procedure(s) performed (including Critical Care): Critical care time 20 minutes.  This includes reviewing the patient's old records in some detail talking to the patient addressing his pain and reviewing his EKGs.  Procedures   ____________________________________________   INITIAL IMPRESSION / ASSESSMENT AND PLAN / ED COURSE  Review of old records show that he has been stented twice and then had a CABG.   he is still having chest pain.  He has a bad history.  Minimal EKG changes.  I think the best thing to do for  this gentleman would be to get him in the hospital.             ____________________________________________   FINAL CLINICAL IMPRESSION(S) / ED DIAGNOSES  Final diagnoses:  Chest pain, unspecified type     ED Discharge Orders    None       Note:  This document was prepared using Dragon voice recognition software and may include unintentional dictation errors.    Arnaldo Natal, MD 03/02/20 [phone]

## 2020-03-02 NOTE — ED Notes (Signed)
Pt otf for imaging 

## 2020-03-02 NOTE — ED Notes (Signed)
Verbal order given by Dr. Darnelle Catalan for 2mg  klonopin PO and 12.5mg  fentanyl IV stat

## 2020-03-02 NOTE — ED Notes (Signed)
PT states his mother who is at bedside will be taking him home. Advised pt that he should not drive due to getting pain meds during stay. PT voices understanding. Pt pushed mother out of ER in wheelchair, gait steady

## 2020-03-02 NOTE — ED Notes (Signed)
Pt refusing discharge vitals.

## 2020-03-02 NOTE — Discharge Instructions (Signed)
Please follow up with your chronic pain clinic for continued medication management.

## 2020-03-02 NOTE — ED Notes (Addendum)
This RN to bedside to give pt oral pain medications ordered by EDP. Placed 2 roxicodone pills in cup and handed cup to pt where pt then placed pills in his hand and closed his hand. Pt put hand up to mouth as if he was taking pills and this RN was unsure if patient actually took medications. While this RN was getting covid swab together pt placed his hand in his pocket and one of the white pills this RN had given patient rolled onto the floor. I asked pt why he did not take medications and why he put them in his pocket. PT states, "I am going to take them". Pt then took both pills. Charge RN Brandy informed, Dr. Scotty Court informed. PT advised he would not get more pain meds due to this incident and pt states "Well I am leaving, if you are going to let me hurt I am leaving, unhook me from all of this shit". Admitting provider on the floor at this time and spoke with Dr. Scotty Court. Pt status changed to leaving AMA at this time

## 2020-03-02 NOTE — ED Provider Notes (Signed)
Procedures     ----------------------------------------- 4:50 PM on 03/02/2020 -----------------------------------------  Assumed care from Dr. Juliette Alcide.  EKG unremarkable x2.  Initial troponin normal.  Went to reassess patient who noted that he was still having severe pain.  He reports that he is on Klonopin 2-3 times a day mg and oxycodone 15 mg every 4 hours from his chronic pain clinic.  Reviewing the PDMP, he is on Klonopin but no oxycodone prescription for the past 5 months.  Given the patient's previous CAD and the concerning nature of the symptoms he describes today, recommended hospitalization which she agrees to.  I ordered his home Klonopin dose as well as 10 mg of oxycodone to help with the potential chronic anxiety/pain element of his presentation.  Nurse reports that she observed him pretending to take the pills and then trying to pocket them instead, which she was able to unequivocally know when he dropped one of the pills on the floor.  Patient was advised that with this behavior will be unable to give him additional opioids to which he demands to be discharged instead of hospitalization.  Strong element of opioid seeking behavior/malingering in his presentation, and the chest pain may entirely be factitious.  Will discharge AGAINST MEDICAL ADVICE due to the uncertainty in his refusal of care at this time. No evidence of intoxication from medications administered so far, has MDM capacity.  Final diagnoses:  Chest pain, unspecified type  malingering    Sharman Cheek, MD 03/02/20 1655

## 2020-03-19 ENCOUNTER — Other Ambulatory Visit: Payer: Self-pay

## 2020-03-19 ENCOUNTER — Encounter: Payer: Self-pay | Admitting: Emergency Medicine

## 2020-03-19 ENCOUNTER — Observation Stay
Admission: EM | Admit: 2020-03-19 | Discharge: 2020-03-19 | Disposition: A | Discharge status: Home / Self Care | Payer: Medicaid Other | Attending: Internal Medicine | Admitting: Internal Medicine

## 2020-03-19 ENCOUNTER — Emergency Department: Payer: Medicaid Other

## 2020-03-19 DIAGNOSIS — I5022 Chronic systolic (congestive) heart failure: Secondary | ICD-10-CM | POA: Diagnosis not present

## 2020-03-19 DIAGNOSIS — R61 Generalized hyperhidrosis: Secondary | ICD-10-CM | POA: Insufficient documentation

## 2020-03-19 DIAGNOSIS — Z79899 Other long term (current) drug therapy: Secondary | ICD-10-CM | POA: Diagnosis not present

## 2020-03-19 DIAGNOSIS — K219 Gastro-esophageal reflux disease without esophagitis: Secondary | ICD-10-CM | POA: Diagnosis not present

## 2020-03-19 DIAGNOSIS — Z87891 Personal history of nicotine dependence: Secondary | ICD-10-CM | POA: Insufficient documentation

## 2020-03-19 DIAGNOSIS — I509 Heart failure, unspecified: Secondary | ICD-10-CM | POA: Diagnosis not present

## 2020-03-19 DIAGNOSIS — R079 Chest pain, unspecified: Secondary | ICD-10-CM | POA: Diagnosis not present

## 2020-03-19 DIAGNOSIS — F419 Anxiety disorder, unspecified: Secondary | ICD-10-CM | POA: Diagnosis present

## 2020-03-19 DIAGNOSIS — D649 Anemia, unspecified: Secondary | ICD-10-CM | POA: Diagnosis not present

## 2020-03-19 DIAGNOSIS — R0602 Shortness of breath: Secondary | ICD-10-CM | POA: Diagnosis present

## 2020-03-19 DIAGNOSIS — I25110 Atherosclerotic heart disease of native coronary artery with unstable angina pectoris: Secondary | ICD-10-CM | POA: Diagnosis not present

## 2020-03-19 DIAGNOSIS — I110 Hypertensive heart disease with heart failure: Secondary | ICD-10-CM | POA: Insufficient documentation

## 2020-03-19 DIAGNOSIS — Z7982 Long term (current) use of aspirin: Secondary | ICD-10-CM | POA: Insufficient documentation

## 2020-03-19 DIAGNOSIS — E875 Hyperkalemia: Secondary | ICD-10-CM | POA: Diagnosis not present

## 2020-03-19 DIAGNOSIS — I10 Essential (primary) hypertension: Secondary | ICD-10-CM | POA: Diagnosis not present

## 2020-03-19 DIAGNOSIS — E785 Hyperlipidemia, unspecified: Secondary | ICD-10-CM | POA: Diagnosis present

## 2020-03-19 DIAGNOSIS — I2510 Atherosclerotic heart disease of native coronary artery without angina pectoris: Secondary | ICD-10-CM | POA: Diagnosis present

## 2020-03-19 DIAGNOSIS — I200 Unstable angina: Secondary | ICD-10-CM

## 2020-03-19 LAB — CBC WITH DIFFERENTIAL/PLATELET
Abs Immature Granulocytes: 0.02 10*3/uL (ref 0.00–0.07)
Basophils Absolute: 0 10*3/uL (ref 0.0–0.1)
Basophils Relative: 1 %
Eosinophils Absolute: 0.1 10*3/uL (ref 0.0–0.5)
Eosinophils Relative: 3 %
HCT: 33.4 % — ABNORMAL LOW (ref 39.0–52.0)
Hemoglobin: 11.1 g/dL — ABNORMAL LOW (ref 13.0–17.0)
Immature Granulocytes: 1 %
Lymphocytes Relative: 24 %
Lymphs Abs: 1.1 10*3/uL (ref 0.7–4.0)
MCH: 33 pg (ref 26.0–34.0)
MCHC: 33.2 g/dL (ref 30.0–36.0)
MCV: 99.4 fL (ref 80.0–100.0)
Monocytes Absolute: 0.6 10*3/uL (ref 0.1–1.0)
Monocytes Relative: 13 %
Neutro Abs: 2.7 10*3/uL (ref 1.7–7.7)
Neutrophils Relative %: 58 %
Platelets: 225 10*3/uL (ref 150–400)
RBC: 3.36 MIL/uL — ABNORMAL LOW (ref 4.22–5.81)
RDW: 14.3 % (ref 11.5–15.5)
WBC: 4.4 10*3/uL (ref 4.0–10.5)
nRBC: 0 % (ref 0.0–0.2)

## 2020-03-19 LAB — COMPREHENSIVE METABOLIC PANEL
ALT: 14 U/L (ref 0–44)
AST: 19 U/L (ref 15–41)
Albumin: 3.8 g/dL (ref 3.5–5.0)
Alkaline Phosphatase: 73 U/L (ref 38–126)
Anion gap: 6 (ref 5–15)
BUN: 18 mg/dL (ref 8–23)
CO2: 30 mmol/L (ref 22–32)
Calcium: 9.1 mg/dL (ref 8.9–10.3)
Chloride: 103 mmol/L (ref 98–111)
Creatinine, Ser: 1 mg/dL (ref 0.61–1.24)
GFR calc Af Amer: >60 mL/min (ref >60)
GFR calc non Af Amer: >60 mL/min (ref >60)
Glucose, Bld: 91 mg/dL (ref 70–99)
Potassium: 5.2 mmol/L — ABNORMAL HIGH (ref 3.5–5.1)
Sodium: 139 mmol/L (ref 135–145)
Total Bilirubin: 0.6 mg/dL (ref 0.3–1.2)
Total Protein: 7.7 g/dL (ref 6.5–8.1)

## 2020-03-19 LAB — TROPONIN I (HIGH SENSITIVITY): Troponin I (High Sensitivity): 14 ng/L (ref <18)

## 2020-03-19 LAB — BRAIN NATRIURETIC PEPTIDE: B Natriuretic Peptide: 47.6 pg/mL (ref 0.0–100.0)

## 2020-03-19 LAB — LIPASE, BLOOD: Lipase: 25 U/L (ref 11–51)

## 2020-03-19 MED ORDER — HYDROMORPHONE HCL 1 MG/ML IJ SOLN
1.0000 mg | Freq: Once | INTRAMUSCULAR | Status: AC
  Administered 2020-03-19: 1 mg via INTRAVENOUS
  Filled 2020-03-19: qty 1

## 2020-03-19 MED ORDER — SODIUM CHLORIDE 0.9 % IV SOLN: INTRAVENOUS | Status: DC

## 2020-03-19 MED ORDER — HYDRALAZINE HCL 20 MG/ML IJ SOLN: 5.0000 mg | INTRAMUSCULAR | Status: DC | PRN

## 2020-03-19 MED ORDER — FENTANYL CITRATE (PF) 100 MCG/2ML IJ SOLN
25.0000 ug | INTRAMUSCULAR | Status: DC | PRN
  Administered 2020-03-19: 25 ug via INTRAVENOUS
  Filled 2020-03-19: qty 2

## 2020-03-19 MED ORDER — SODIUM CHLORIDE 0.9 % IV SOLN: Freq: Once | INTRAVENOUS | Status: AC

## 2020-03-19 MED ORDER — ONDANSETRON HCL 4 MG/2ML IJ SOLN
4.0000 mg | Freq: Once | INTRAMUSCULAR | Status: AC
  Administered 2020-03-19: 4 mg via INTRAVENOUS
  Filled 2020-03-19: qty 2

## 2020-03-19 MED ORDER — ONDANSETRON HCL 4 MG/2ML IJ SOLN: 4.0000 mg | Freq: Three times a day (TID) | INTRAMUSCULAR | Status: DC | PRN

## 2020-03-19 MED ORDER — HEPARIN BOLUS VIA INFUSION
3800.0000 [IU] | Freq: Once | INTRAVENOUS | Status: AC
  Administered 2020-03-19: 3800 [IU] via INTRAVENOUS
  Filled 2020-03-19: qty 3800

## 2020-03-19 MED ORDER — NITROGLYCERIN 0.4 MG SL SUBL
0.4000 mg | SUBLINGUAL_TABLET | SUBLINGUAL | Status: DC | PRN
  Administered 2020-03-19: 0.4 mg via SUBLINGUAL
  Filled 2020-03-19: qty 1

## 2020-03-19 MED ORDER — HEPARIN (PORCINE) 25000 UT/250ML-% IV SOLN
750.0000 [IU]/h | INTRAVENOUS | Status: DC
  Administered 2020-03-19: 750 [IU]/h via INTRAVENOUS
  Filled 2020-03-19: qty 250

## 2020-03-19 MED ORDER — ATORVASTATIN CALCIUM 20 MG PO TABS: 40.0000 mg | ORAL_TABLET | Freq: Every day | ORAL | Status: DC

## 2020-03-19 MED ORDER — ACETAMINOPHEN 325 MG PO TABS: 650.0000 mg | ORAL_TABLET | Freq: Four times a day (QID) | ORAL | Status: DC | PRN

## 2020-03-19 MED ORDER — FENTANYL CITRATE (PF) 100 MCG/2ML IJ SOLN
50.0000 ug | Freq: Once | INTRAMUSCULAR | Status: AC
  Administered 2020-03-19: 50 ug via INTRAVENOUS
  Filled 2020-03-19: qty 2

## 2020-03-19 NOTE — Progress Notes (Addendum)
  Called to see patient about possible STEMI.  Patient with known coronary artery disease, presents with 30 to 45 minutes of substernal chest discomfort with radiation to left arm. 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

## 2020-03-19 NOTE — ED Notes (Addendum)
Pt has been calling out for pain meds every 10-15 mins. See MAR. Pt given 50 fentanyl, SL nitro, hydromorphone 1 mg x 2 doses, 25 fentanyl between 1149 and 1428 without relief.  Pt had let edp and admitting md know about being under pain management and felt like he would need more than usual pain meds. Last dose of fentanyl given at 1428 and at that time pt was informed that the MD had ordered fentanyl every 2 hours as needed and the next dose could be at 1628. At 1500 called into pts room. Pt was standing at end of stretcher, had removed 1 iv and was saying that he was going to [facility] where they would not let him be in pain. 2nd iv removed and pt left while admitting md was being notified. Pt in NSR with stable VS and was able to ambulate to lobby.

## 2020-03-19 NOTE — ED Notes (Signed)
Pt c/o no effect with pain med. Dr Alyce Pagan notified. Nitro sl ordered and given

## 2020-03-19 NOTE — Discharge Summary (Addendum)
Physician Discharge Summary  Ethan White QMG:[id number] DOB: 1960-09-12 DOA: 03/19/2020  PCP: Patient, No Pcp Per  Admit date: 03/19/2020 Discharge date: 03/19/2020  Recommendations for Outpatient Follow-up:  None sine pt left on AMA   Home Health:none Equipment/Devices:none  Discharge Condition: no significant change, but hemodynamically stable CODE STATUS: Full Diet recommendation: Heart diet  Brief/Interim Summary (HPI)  Ethan White is a 59 y.o. male with medical history significant of hypertension, hyperlipidemia, CAD, stent placement, sCHF with EF of 35 to 40%, anemia, GERD, former smoker, who presents with chest pain.  Patient states that his chest pain started with this morning, which is located in substernal area, 8 out of 10 severity, pressure-like, radiating to left arm and the neck.  Associated with mild shortness breath.  No cough, fever or chills.  Patient denies nausea, vomiting, diarrhea, abdominal pain, symptoms of UTI or unilateral weakness. No tenderness in calf areas.  ED Course: pt was found to have troponin 14, WBC 4.4, BNP 47.6, pending COVID-19 PCR, lipase 25, potassium 5.2, renal function okay, temperature normal, blood pressure 124/92, heart rate 84, oxygen saturation 99% on room air.  Chest x-ray negative.  Patient is placed on progressive bed for observation.  Dr. Gwen Pounds of cardiology is consulted.   Subjective  -Chest pain and shortness of breath  Discharge Diagnoses and Hospital Course:   Principal Problem:   Chest pain Active Problems:   CAD (coronary artery disease)   Benign essential HTN   HLD (hyperlipidemia)   Chronic systolic CHF (congestive heart failure) (HCC)   Normocytic anemia   Hyperkalemia   GERD (gastroesophageal reflux disease)   Anxiety   Chest pain and hx of CAD: s/p of stent.  Initial troponin negative.  Patient has persistent chest pain, concerning for unstable angina.  Cardiology, Dr. Gwen Pounds is consulted.  Plan  was made as below, unfortunately patient left hospital AMA.  - placed to progressive unit for observation - IV heparin started in ED -->will continue - Trend Trop - Repeat EKG in the am  - prn Nitroglycerin, as needed fentanyl - on aspirin, plavix, lipitor  - Risk factor stratification: will check FLP and A1C  - check UDS  Benign essential HTN -IV hydralazine as needed -Continue metoprolol  HLD (hyperlipidemia) -Lipitor  Chronic systolic CHF (congestive heart failure) (HCC): 2D echo on 04/08/2017 showed EF 35-40% with grade 1 diastolic dysfunction.  Patient does not have any leg edema.  No pulmonary edema chest x-ray.  BNP 47.6.  CHF is compensated.  Patient is not taking diuretics at home. -Watch Volume status closely -Continued aspirin and metoprolol  Normocytic anemia: Hemoglobin 11.1, stable -Follow-up with CBC  Hyperkalemia: Potassium 5.2. -IV fluid: 75 cc/h of normal saline -Repeat BMP in morning  Anxiety: -Continue home Klonopin    Discharge Instructions     Allergies  Allergen Reactions  . Ketorolac Hives  . Tramadol Hives  . Morphine And Related     Hives, nausea     Consultations:  cardiology   Procedures/Studies: DG Chest 2 View  Result Date: 03/02/2020 CLINICAL DATA:  Chest pain EXAM: CHEST - 2 VIEW COMPARISON:  November 03, 2018 FINDINGS: The heart size and mediastinal contours are within normal limits. Both lungs are clear. The visualized skeletal structures are unremarkable. Calcified granulomas are noted. Atherosclerotic changes are noted of the thoracic aorta. IMPRESSION: No active cardiopulmonary disease. Electronically Signed   By: Katherine Mantle M.D.   On: 03/02/2020 15:16   DG Chest Portable 1 View  Result Date: 03/19/2020 CLINICAL DATA:  Short of breath. Chest pain beginning 30 minutes ago. EXAM: PORTABLE CHEST 1 VIEW COMPARISON:  03/02/2020 FINDINGS: Cardiac silhouette is normal in size. No mediastinal or hilar masses or  evidence of adenopathy. Lungs are hyperexpanded, but clear. No pleural effusion or pneumothorax. Skeletal structures are grossly intact. IMPRESSION: No acute cardiopulmonary disease. Electronically Signed   By: Amie Portland M.D.   On: 03/19/2020 11:59       Discharge Exam: Vitals:   03/19/20 1156  BP: (!) 124/92  Pulse: 84  Temp: 98.2 F (36.8 C)  SpO2: 99%   Vitals:   03/19/20 1156 03/19/20 1159  BP: (!) 124/92   Pulse: 84   Temp: 98.2 F (36.8 C)   TempSrc: Oral   SpO2: 99%   Weight:  63.5 kg  Height:  6\' 2"  (1.88 m)   PE was not done since pt left on AMA and I did no have a chance to see patient     The results of significant diagnostics from this hospitalization (including imaging, microbiology, ancillary and laboratory) are listed below for reference.     Microbiology: No results found for this or any previous visit (from the past 240 hour(s)).   Labs: BNP (last 3 results) Recent Labs    03/19/20 1138  BNP 47.6   Basic Metabolic Panel: Recent Labs  Lab 03/19/20 1138  NA 139  K 5.2*  CL 103  CO2 30  GLUCOSE 91  BUN 18  CREATININE 1.00  CALCIUM 9.1   Liver Function Tests: Recent Labs  Lab 03/19/20 1138  AST 19  ALT 14  ALKPHOS 73  BILITOT 0.6  PROT 7.7  ALBUMIN 3.8   Recent Labs  Lab 03/19/20 1138  LIPASE 25   No results for input(s): AMMONIA in the last 168 hours. CBC: Recent Labs  Lab 03/19/20 1138  WBC 4.4  NEUTROABS 2.7  HGB 11.1*  HCT 33.4*  MCV 99.4  PLT 225   Cardiac Enzymes: No results for input(s): CKTOTAL, CKMB, CKMBINDEX, TROPONINI in the last 168 hours. BNP: Invalid input(s): POCBNP CBG: No results for input(s): GLUCAP in the last 168 hours. D-Dimer No results for input(s): DDIMER in the last 72 hours. Hgb A1c No results for input(s): HGBA1C in the last 72 hours. Lipid Profile No results for input(s): CHOL, HDL, LDLCALC, TRIG, CHOLHDL, LDLDIRECT in the last 72 hours. Thyroid function studies No results  for input(s): TSH, T4TOTAL, T3FREE, THYROIDAB in the last 72 hours.  Invalid input(s): FREET3 Anemia work up No results for input(s): VITAMINB12, FOLATE, FERRITIN, TIBC, IRON, RETICCTPCT in the last 72 hours. Urinalysis    Component Value Date/Time   COLORURINE Yellow 03/16/2013 0127   APPEARANCEUR Clear 03/16/2013 0127   LABSPEC 1.008 03/16/2013 0127   PHURINE 8.0 03/16/2013 0127   GLUCOSEU Negative 03/16/2013 0127   HGBUR Negative 03/16/2013 0127   BILIRUBINUR Negative 03/16/2013 0127   KETONESUR Negative 03/16/2013 0127   PROTEINUR Negative 03/16/2013 0127   NITRITE Negative 03/16/2013 0127   LEUKOCYTESUR Negative 03/16/2013 0127   Sepsis Labs Invalid input(s): PROCALCITONIN,  WBC,  LACTICIDVEN Microbiology No results found for this or any previous visit (from the past 240 hour(s)).  Time coordinating discharge:  25 minutes.   SIGNED:  03/18/2013, MD [facility] 03/19/2020, 3:42 PM   If 7PM-7AM, please contact night-coverage [email or web address]

## 2020-03-19 NOTE — H&P (Signed)
History and Physical    Ethan White TML:[id number] DOB: [birth year] DOA: 03/19/2020  Referring MD/NP/PA:   PCP: Patient, No Pcp Per   Patient coming from:  The patient is coming from home.  At baseline, pt is independent for most of ADL.        Chief Complaint: chest pain  HPI: Ethan White is a 59 y.o. male with medical history significant of hypertension, hyperlipidemia, CAD, stent placement, sCHF with EF of 35 to 40%, anemia, GERD, former smoker, who presents with chest pain.  Patient states that his chest pain started with this morning, which is located in substernal area, 8 out of 10 severity, pressure-like, radiating to left arm and the neck.  Associated with mild shortness breath.  No cough, fever or chills.  Patient denies nausea, vomiting, diarrhea, abdominal pain, symptoms of UTI or unilateral weakness. No tenderness in calf areas.  ED Course: pt was found to have troponin 14, WBC 4.4, BNP 47.6, pending COVID-19 PCR, lipase 25, potassium 5.2, renal function okay, temperature normal, blood pressure 124/92, heart rate 84, oxygen saturation 99% on room air.  Chest x-ray negative.  Patient is placed on progressive bed for observation.  Dr. Gwen Pounds of cardiology is consulted.  Review of Systems:   General: no fevers, chills, no body weight gain, has fatigue HEENT: no blurry vision, hearing changes or sore throat Respiratory: has dyspnea, no coughing, wheezing CV: has chest pain, no palpitations GI: no nausea, vomiting, abdominal pain, diarrhea, constipation GU: no dysuria, burning on urination, increased urinary frequency, hematuria  Ext: no leg edema Neuro: no unilateral weakness, numbness, or tingling, no vision change or hearing loss Skin: no rash, no skin tear. MSK: No muscle spasm, no deformity, no limitation of range of movement in spin Heme: No easy bruising.  Travel history: No recent long distant travel.  Allergy:  Allergies  Allergen Reactions  . Ketorolac  Hives  . Tramadol Hives  . Morphine And Related     Hives, nausea     Past Medical History:  Diagnosis Date  . CHF (congestive heart failure) (HCC)   . Coronary artery disease 04/08/2017   single vessel  . Hypertension   . MI (myocardial infarction) (HCC)    x2     Past Surgical History:  Procedure Laterality Date  . LEFT HEART CATH AND CORONARY ANGIOGRAPHY N/A 04/08/2017   Procedure: LEFT HEART CATH AND CORONARY ANGIOGRAPHY;  Surgeon: Swazil[place], Peter M, MD;  Location: [facility] INVASIVE CV LAB;  Service: Cardiovascular;  Laterality: N/A;    Social History:  reports that he has quit smoking. He has never used smokeless tobacco. He reports that he does not drink alcohol and does not use drugs.  Family History: Reviewed with patient, patient states that his mother had back surgery due to back pain otherwise no significant family medical history.  Prior to Admission medications   Medication Sig Start Date End Date Taking? Authorizing Provider  acetaminophen (TYLENOL) 325 MG tablet Take 650 mg by mouth 3 (three) times daily as needed for moderate pain or headache.     [provider]  aspirin EC 81 MG tablet Take 81 mg by mouth daily.     [provider]  atorvastatin (LIPITOR) 40 MG tablet Take 40 mg by mouth at bedtime.     [provider]  clopidogrel (PLAVIX) 75 MG tablet Take 75 mg by mouth daily.  12/12/15   [provider]  metoprolol succinate (TOPROL-XL) 100 MG 24 hr  tablet Take 100 mg by mouth 2 (two) times daily. Take with or immediately following a meal.    [provider]  nitroGLYCERIN (NITROSTAT) 0.4 MG SL tablet Place 0.4 mg under the tongue every 5 (five) minutes as needed for chest pain.     [provider]  pantoprazole (PROTONIX) 40 MG tablet Take 40 mg by mouth daily.    [provider]  traMADol (ULTRAM) 50 MG tablet Take 100 mg by mouth every 8 (eight) hours.    [provider]    Physical  Exam: Vitals:   03/19/20 1156 03/19/20 1159  BP: (!) 124/92   Pulse: 84   Temp: 98.2 F (36.8 C)   TempSrc: Oral   SpO2: 99%   Weight:  63.5 kg  Height:  6\' 2"  (1.88 m)   General: Not in acute distress HEENT:       Eyes: PERRL, EOMI, no scleral icterus.       ENT: No discharge from the ears and nose, no pharynx injection, no tonsillar enlargement.        Neck: No JVD, no bruit, no mass felt. Heme: No neck lymph node enlargement. Cardiac: S1/S2, RRR, No murmurs, No gallops or rubs. Respiratory:  No rales, wheezing, rhonchi or rubs. GI: Soft, nondistended, nontender, no rebound pain, no organomegaly, BS present. GU: No hematuria Ext: No pitting leg edema bilaterally. 2+DP/PT pulse bilaterally. Musculoskeletal: No joint deformities, No joint redness or warmth, no limitation of ROM in spin. Skin: No rashes.  Neuro: Alert, oriented X3, cranial nerves II-XII grossly intact, moves all extremities normally. Psych: Patient is not psychotic, no suicidal or hemocidal ideation.  Labs on Admission: I have personally reviewed following labs and imaging studies  CBC: Recent Labs  Lab 03/19/20 1138  WBC 4.4  NEUTROABS 2.7  HGB 11.1*  HCT 33.4*  MCV 99.4  PLT 225   Basic Metabolic Panel: Recent Labs  Lab 03/19/20 1138  NA 139  K 5.2*  CL 103  CO2 30  GLUCOSE 91  BUN 18  CREATININE 1.00  CALCIUM 9.1   GFR: Estimated Creatinine Clearance: 69.7 mL/min (by C-G formula based on SCr of 1 mg/dL). Liver Function Tests: Recent Labs  Lab 03/19/20 1138  AST 19  ALT 14  ALKPHOS 73  BILITOT 0.6  PROT 7.7  ALBUMIN 3.8   Recent Labs  Lab 03/19/20 1138  LIPASE 25   No results for input(s): AMMONIA in the last 168 hours. Coagulation Profile: No results for input(s): INR, PROTIME in the last 168 hours. Cardiac Enzymes: No results for input(s): CKTOTAL, CKMB, CKMBINDEX, TROPONINI in the last 168 hours. BNP (last 3 results) No results for input(s): PROBNP in the last 8760  hours. HbA1C: No results for input(s): HGBA1C in the last 72 hours. CBG: No results for input(s): GLUCAP in the last 168 hours. Lipid Profile: No results for input(s): CHOL, HDL, LDLCALC, TRIG, CHOLHDL, LDLDIRECT in the last 72 hours. Thyroid Function Tests: No results for input(s): TSH, T4TOTAL, FREET4, T3FREE, THYROIDAB in the last 72 hours. Anemia Panel: No results for input(s): VITAMINB12, FOLATE, FERRITIN, TIBC, IRON, RETICCTPCT in the last 72 hours. Urine analysis:    Component Value Date/Time   COLORURINE Yellow 03/16/2013 0127   APPEARANCEUR Clear 03/16/2013 0127   LABSPEC 1.008 03/16/2013 0127   PHURINE 8.0 03/16/2013 0127   GLUCOSEU Negative 03/16/2013 0127   HGBUR Negative 03/16/2013 0127   BILIRUBINUR Negative 03/16/2013 0127   KETONESUR Negative 03/16/2013 0127   PROTEINUR Negative  03/16/2013 0127   NITRITE Negative 03/16/2013 0127   LEUKOCYTESUR Negative 03/16/2013 0127   Sepsis Labs: @LABRCNTIP (procalcitonin:4,lacticidven:4) )No results found for this or any previous visit (from the past 240 hour(s)).   Radiological Exams on Admission: DG Chest Portable 1 View  Result Date: 03/19/2020 CLINICAL DATA:  Short of breath. Chest pain beginning 30 minutes ago. EXAM: PORTABLE CHEST 1 VIEW COMPARISON:  03/02/2020 FINDINGS: Cardiac silhouette is normal in size. No mediastinal or hilar masses or evidence of adenopathy. Lungs are hyperexpanded, but clear. No pleural effusion or pneumothorax. Skeletal structures are grossly intact. IMPRESSION: No acute cardiopulmonary disease. Electronically Signed   By: 05/03/2020 M.D.   On: 03/19/2020 11:59     EKG: Independently reviewed.  Sinus rhythm, QTC 479, LAE, mild T wave inversion in V5-V6  Assessment/Plan Principal Problem:   Chest pain Active Problems:   CAD (coronary artery disease)   Benign essential HTN   HLD (hyperlipidemia)   Chronic systolic CHF (congestive heart failure) (HCC)   Normocytic anemia    Hyperkalemia   GERD (gastroesophageal reflux disease)   Anxiety   Chest pain and hx of CAD: s/p of stent.  Initial troponin negative.  Patient has persistent chest pain, concerning for unstable angina.  Cardiology, Dr. 03/21/2020 is consulted.  - place to progressive unit for observation - IV heparin started in ED -->will continue - Trend Trop - Repeat EKG in the am  - prn Nitroglycerin, as needed fentanyl - on aspirin, plavix, lipitor  - Risk factor stratification: will check FLP and A1C  - check UDS  Benign essential HTN -IV hydralazine as needed -Continue metoprolol  HLD (hyperlipidemia) -Lipitor  Chronic systolic CHF (congestive heart failure) (HCC): 2D echo on 04/08/2017 showed EF 35-40% with grade 1 diastolic dysfunction.  Patient does not have any leg edema.  No pulmonary edema chest x-ray.  BNP 47.6.  CHF is compensated.  Patient is not taking diuretics at home. -Watch Volume status closely -Continue aspirin and metoprolol  Normocytic anemia: Hemoglobin 11.1, stable -Follow-up with CBC  Hyperkalemia: Potassium 5.2. -IV fluid: 75 cc/h of normal saline -Repeat BMP in morning  Anxiety: -Continue home Klonopin      DVT ppx: on IV Heparin   Code Status: Full code Family Communication:  Yes, patient's father  at bed side Disposition Plan:  Anticipate discharge back to previous environment Consults called:  Dr. 04/10/2017 of cardiology Admission status:   progressive unit for obs      Status is: Observation  The patient remains OBS appropriate and will d/c before 2 midnights.  Dispo: The patient is from: Home              Anticipated d/c is to: Home              Anticipated d/c date is: 1 day              Patient currently is not medically stable to d/c.           Date of Service 03/19/2020    03/21/2020 [facility]   If 7PM-7AM, please contact night-coverage [email or web address] 03/19/2020, 3:40 PM

## 2020-03-19 NOTE — Consult Note (Signed)
ANTICOAGULATION CONSULT NOTE - Initial Consult  Pharmacy Consult for Heparin Indication: chest pain/ACS  Allergies  Allergen Reactions   Ketorolac Hives   Tramadol Hives   Morphine And Related     Hives, nausea     Patient Measurements: Height: 6\' 2"  (188 cm) Weight: 63.5 kg (140 lb) IBW/kg (Calculated) : 82.2 Heparin Dosing Weight: 63.5 kg  Vital Signs: Temp: 98.2 F (36.8 C) (07/28 1156) Temp Source: Oral (07/28 1156) BP: 124/92 (07/28 1156) Pulse Rate: 84 (07/28 1156)  Labs: Recent Labs    03/19/20 1138  HGB 11.1*  HCT 33.4*  PLT 225  CREATININE 1.00  TROPONINIHS 14    Estimated Creatinine Clearance: 69.7 mL/min (by C-G formula based on SCr of 1 mg/dL).   Medical History: Past Medical History:  Diagnosis Date   CHF (congestive heart failure) (HCC)    Coronary artery disease 04/08/2017   single vessel   Hypertension    MI (myocardial infarction) (HCC)    x2     Medications:  (Not in a hospital admission)  Scheduled:    HYDROmorphone (DILAUDID) injection  1 mg Intravenous Once   Infusions:   PRN: nitroGLYCERIN Anti-infectives (From admission, onward)   None      Assessment: Pharmacy consulted to start heparin for ACS. Trop negative. No DOAC noted PTA.   Goal of Therapy:  Heparin level 0.3-0.7 units/ml Monitor platelets by anticoagulation protocol: Yes   Plan:  Give 3800 units bolus x 1 Start heparin infusion at 750 units/hr Check anti-Xa level in 6 hours and daily while on heparin Continue to monitor H&H and platelets  04/10/2017, PharmD, BCPS 03/19/2020,12:18 PM

## 2020-03-19 NOTE — ED Triage Notes (Signed)
Pt with chest pain that started 30 min ago. Pain center chest and radiating to neck and left arm. Pt given 324 aspirin by ems. Pt with hx mi 2007.

## 2020-03-19 NOTE — Consult Note (Signed)
[facility] Cardiology Consultation Note  Patient ID: Ethan White, MRN: [id number], DOB/AGE: 05/04/1961 59 y.o. Admit date: 03/19/2020   Date of Consult: 03/19/2020 Primary Physician: Patient, No Pcp Per Primary Cardiologist: None  Chief Complaint:  Chief Complaint  Patient presents with  . Chest Pain   Reason for Consult: Chest pain  HPI: 59 y.o. male with known coronary artery disease status post PCI and stent placement apparent previous myocardial infarction with hypertension hyperlipidemia and continued tobacco abuse.  Patient had his last intervention in 2008 and done well with his medication management.  With this tobacco abuse there is concerns.  The patient has had no evidence of significant progression of shortness of breath or chest pain or anginal symptoms in the last many weeks.  He was at a funeral where he sat down after being very hot underneath the tree and then realized he was going to the ambulance.  It was unclear whether the patient truly had some chest pain or other significant symptoms but when seen in the emergency room had concerns of EKG changes.  EKG overall shows normal sinus rhythm with no evidence of ST elevation.  In addition to that current troponin is 8 not consistent with myocardial infarction.  Currently the patient now is feeling well and hemodynamically stable  Past Medical History:  Diagnosis Date  . CHF (congestive heart failure) (HCC)   . Coronary artery disease 04/08/2017   single vessel  . Hypertension   . MI (myocardial infarction) [facility])    x2       Surgical History:  Past Surgical History:  Procedure Laterality Date  . LEFT HEART CATH AND CORONARY ANGIOGRAPHY N/A 04/08/2017   Procedure: LEFT HEART CATH AND CORONARY ANGIOGRAPHY;  Surgeon: Swazil[place], Peter M, MD;  Location: [facility] INVASIVE CV LAB;  Service: Cardiovascular;  Laterality: N/A;     Home Meds: Prior to Admission medications   Medication Sig Start Date End Date Taking? Authorizing  Provider  aspirin EC 81 MG tablet Take 81 mg by mouth daily.    Yes [provider]  atorvastatin (LIPITOR) 40 MG tablet Take 40 mg by mouth at bedtime.    Yes [provider]  clonazePAM (KLONOPIN) 2 MG tablet Take 2 mg by mouth in the morning, at noon, and at bedtime.  02/27/20  Yes [provider]  clopidogrel (PLAVIX) 75 MG tablet Take 75 mg by mouth daily.  12/12/15  Yes [provider]  gabapentin (NEURONTIN) 300 MG capsule Take 300 mg by mouth 3 (three) times daily. 03/01/20  Yes [provider]  metoprolol succinate (TOPROL-XL) 100 MG 24 hr tablet Take 100 mg by mouth 2 (two) times daily. Take with or immediately following a meal.   Yes [provider]  oxyCODONE-acetaminophen (PERCOCET) 10-325 MG tablet Take 1 tablet by mouth 5 (five) times daily. 10/08/19  Yes [provider]  nitroGLYCERIN (NITROSTAT) 0.4 MG SL tablet Place 0.4 mg under the tongue every 5 (five) minutes as needed for chest pain.     [provider]    Inpatient Medications:   . sodium chloride    . heparin 750 Units/hr (03/19/20 1232)    Allergies:  Allergies  Allergen Reactions  . Ketorolac Hives  . Tramadol Hives  . Morphine And Related     Hives, nausea     Social History   Socioeconomic History  . Marital status: Single    Spouse name: Not on file  . Number of children: Not  on file  . Years of education: Not on file  . Highest education level: Not on file  Occupational History  . Not on file  Tobacco Use  . Smoking status: Former Games developer  . Smokeless tobacco: Never Used  Vaping Use  . Vaping Use: Never used  Substance and Sexual Activity  . Alcohol use: No  . Drug use: No  . Sexual activity: Yes  Other Topics Concern  . Not on file  Social History Narrative  . Not on file   Social Determinants of Health   Financial Resource Strain:   . Difficulty of Paying Living Expenses:   Food Insecurity:   . Worried About  Programme researcher, broadcasting/film/video in the Last Year:   . Barista in the Last Year:   Transportation Needs:   . Freight forwarder (Medical):   Marland Kitchen Lack of Transportation (Non-Medical):   Physical Activity:   . Days of Exercise per Week:   . Minutes of Exercise per Session:   Stress:   . Feeling of Stress :   Social Connections:   . Frequency of Communication with Friends and Family:   . Frequency of Social Gatherings with Friends and Family:   . Attends Religious Services:   . Active Member of Clubs or Organizations:   . Attends Banker Meetings:   Marland Kitchen Marital Status:   Intimate Partner Violence:   . Fear of Current or Ex-Partner:   . Emotionally Abused:   Marland Kitchen Physically Abused:   . Sexually Abused:      History reviewed. No pertinent family history.   Review of Systems Positive for weakness Negative for: General:  chills, fever, night sweats or weight changes.  Cardiovascular: PND orthopnea syncope dizziness  Dermatological skin lesions rashes Respiratory: Cough congestion Urologic: Frequent urination urination at night and hematuria Abdominal: negative for nausea, vomiting, diarrhea, bright red blood per rectum, melena, or hematemesis Neurologic: negative for visual changes, and/or hearing changes  All other systems reviewed and are otherwise negative except as noted above.  Labs: No results for input(s): CKTOTAL, CKMB, TROPONINI in the last 72 hours. Lab Results  Component Value Date   WBC 4.4 03/19/2020   HGB 11.1 (L) 03/19/2020   HCT 33.4 (L) 03/19/2020   MCV 99.4 03/19/2020   PLT 225 03/19/2020    Recent Labs  Lab 03/19/20 1138  NA 139  K 5.2*  CL 103  CO2 30  BUN 18  CREATININE 1.00  CALCIUM 9.1  PROT 7.7  BILITOT 0.6  ALKPHOS 73  ALT 14  AST 19  GLUCOSE 91   Lab Results  Component Value Date   CHOL 121 08/18/2014   HDL 38 (L) 08/18/2014   LDLCALC 66 08/18/2014   TRIG 86 08/18/2014   No results found for: DDIMER  Radiology/Studies:   DG Chest 2 View  Result Date: 03/02/2020 CLINICAL DATA:  Chest pain EXAM: CHEST - 2 VIEW COMPARISON:  November 03, 2018 FINDINGS: The heart size and mediastinal contours are within normal limits. Both lungs are clear. The visualized skeletal structures are unremarkable. Calcified granulomas are noted. Atherosclerotic changes are noted of the thoracic aorta. IMPRESSION: No active cardiopulmonary disease. Electronically Signed   By: Katherine Mantle M.D.   On: 03/02/2020 15:16   DG Chest Portable 1 View  Result Date: 03/19/2020 CLINICAL DATA:  Short of breath. Chest pain beginning 30 minutes ago. EXAM: PORTABLE CHEST 1 VIEW COMPARISON:  03/02/2020 FINDINGS: Cardiac silhouette is normal in size.  No mediastinal or hilar masses or evidence of adenopathy. Lungs are hyperexpanded, but clear. No pleural effusion or pneumothorax. Skeletal structures are grossly intact. IMPRESSION: No acute cardiopulmonary disease. Electronically Signed   By: Amie Portland M.D.   On: 03/19/2020 11:59    EKG: Normal sinus rhythm with septal infarct age undetermined  Weights: Filed Weights   03/19/20 1159  Weight: 63.5 kg     Physical Exam: Blood pressure (!) 124/92, pulse 84, temperature 98.2 F (36.8 C), temperature source Oral, height 6\' 2"  (1.88 m), weight 63.5 kg, SpO2 99 %. Body mass index is 17.97 kg/m. General: Well developed, well nourished, in no acute distress. Head eyes ears nose throat: Normocephalic, atraumatic, sclera non-icteric, no xanthomas, nares are without discharge. No apparent thyromegaly and/or mass  Lungs: Normal respiratory effort.  no wheezes, no rales, no rhonchi.  Heart: RRR with normal S1 S2. no murmur gallop, no rub, PMI is normal size and placement, carotid upstroke normal without bruit, jugular venous pressure is normal Abdomen: Soft, non-tender, non-distended with normoactive bowel sounds. No hepatomegaly. No rebound/guarding. No obvious abdominal masses. Abdominal aorta is normal  size without bruit Extremities: No edema. no cyanosis, no clubbing, no ulcers  Peripheral : 2+ bilateral upper extremity pulses, 2+ bilateral femoral pulses, 2+ bilateral dorsal pedal pulse Neuro: Alert and oriented. No facial asymmetry. No focal deficit. Moves all extremities spontaneously. Musculoskeletal: Normal muscle tone without kyphosis Psych:  Responds to questions appropriately with a normal affect.    Assessment: 59 year old male with tobacco abuse hypertension hyperlipidemia coronary artery disease with weakness but no apparent significant angina and or congestive heart failure or myocardial infarction at this time needing further observation and treatment options  Plan: 1.  Okay to continue aspirin and antiplatelet medication management as well as heparin for further risk reduction and possible myocardial infarction 2.  Serial ECG and enzymes to assess for myocardial infarction 3.  Continue high intensity cholesterol therapy hypertension therapy and nitrates if necessary for anginal symptoms 4.  Further intervention and/or treatment options after event depending on results.  This also includes the possibility of cardiac catheterization.  The patient understands the risk and benefits of cardiac catheterization.  This includes the possibility of death stroke heart attack infection bleeding blood clot.  He is at low risk for conscious sedation.  Will likely further evaluate in a.m. for the possibility of further intervention  Signed, 75 M.D. [facility] Cardiology 03/19/2020, 1:56 PM

## 2020-03-19 NOTE — ED Provider Notes (Signed)
[facility] Emergency Department Provider Note  ____________________________________________   First MD Initiated Contact with Patient 03/19/20 1138     (approximate)  I have reviewed the triage vital signs and the nursing notes.   HISTORY  Chief Complaint Chest Pain    HPI Ethan White is a 59 y.o. male with history of hypertension, CAD, CHF, here with chest pain.  The patient has known coronary disease status post stenting back in 2018.  He was just seen for similar complaints several weeks ago.  He states that he was out in his yard today when he experienced acute onset of severe, aching, substernal chest pressure.  The pain feels similar to his prior MIs.  He has associated shortness of breath and radiation down his left arm.  Denies any alleviating factors.  He was initially activated as a code STEMI in the field, which was canceled on arrival due to reassuring repeat EKG.  He has not been given any meds on arrival.  Denies any specific alleviating or aggravating factors.   He states he has been taking his Plavix as prescribed.       Past Medical History:  Diagnosis Date  . CHF (congestive heart failure) (HCC)   . Coronary artery disease 04/08/2017   single vessel  . Hypertension   . MI (myocardial infarction) [facility])    x2     Patient Active Problem List   Diagnosis Date Noted  . Hyperkalemia 03/19/2020  . GERD (gastroesophageal reflux disease) 03/19/2020  . Anxiety 03/19/2020  . Chest pain 04/08/2017  . CAD (coronary artery disease) 04/08/2017  . Benign essential HTN 04/08/2017  . HLD (hyperlipidemia) 04/08/2017  . Chronic systolic CHF (congestive heart failure) (HCC) 04/08/2017  . Normocytic anemia 04/08/2017  . Thrombocytopenia (HCC) 04/08/2017  . Unstable angina (HCC) 04/07/2017    Past Surgical History:  Procedure Laterality Date  . LEFT HEART CATH AND CORONARY ANGIOGRAPHY N/A 04/08/2017   Procedure: LEFT HEART CATH AND CORONARY  ANGIOGRAPHY;  Surgeon: [place], Peter M, MD;  Location: [facility] INVASIVE CV LAB;  Service: Cardiovascular;  Laterality: N/A;    Prior to Admission medications   Medication Sig Start Date End Date Taking? Authorizing Provider  aspirin EC 81 MG tablet Take 81 mg by mouth daily.    Yes [provider]  atorvastatin (LIPITOR) 40 MG tablet Take 40 mg by mouth at bedtime.    Yes [provider]  clonazePAM (KLONOPIN) 2 MG tablet Take 2 mg by mouth in the morning, at noon, and at bedtime.  02/27/20  Yes [provider]  clopidogrel (PLAVIX) 75 MG tablet Take 75 mg by mouth daily.  12/12/15  Yes [provider]  gabapentin (NEURONTIN) 300 MG capsule Take 300 mg by mouth 3 (three) times daily. 03/01/20  Yes [provider]  metoprolol succinate (TOPROL-XL) 100 MG 24 hr tablet Take 100 mg by mouth 2 (two) times daily. Take with or immediately following a meal.   Yes [provider]  oxyCODONE-acetaminophen (PERCOCET) 10-325 MG tablet Take 1 tablet by mouth 5 (five) times daily. 10/08/19  Yes [provider]  nitroGLYCERIN (NITROSTAT) 0.4 MG SL tablet Place 0.4 mg under the tongue every 5 (five) minutes as needed for chest pain.     [provider]    Allergies Ketorolac, Tramadol, and Morphine and related  History reviewed. No pertinent family history.  Social History Social History   Tobacco Use  . Smoking status: Former Games developer  .  Smokeless tobacco: Never Used  Vaping Use  . Vaping Use: Never used  Substance Use Topics  . Alcohol use: No  . Drug use: No    Review of Systems  Review of Systems  Constitutional: Positive for diaphoresis. Negative for chills, fatigue and fever.  HENT: Negative for sore throat.   Respiratory: Positive for chest tightness and shortness of breath.   Cardiovascular: Positive for chest pain.  Gastrointestinal: Negative for abdominal pain.  Genitourinary: Negative for flank pain.  Musculoskeletal:  Negative for neck pain.  Skin: Negative for rash and wound.  Allergic/Immunologic: Negative for immunocompromised state.  Neurological: Negative for weakness and numbness.  Hematological: Does not bruise/bleed easily.  All other systems reviewed and are negative.    ____________________________________________  PHYSICAL EXAM:      VITAL SIGNS: ED Triage Vitals  Enc Vitals Group     BP      Pulse      Resp      Temp      Temp src      SpO2      Weight      Height      Head Circumference      Peak Flow      Pain Score      Pain Loc      Pain Edu?      Excl. in GC?      Physical Exam Vitals and nursing note reviewed.  Constitutional:      General: He is not in acute distress.    Appearance: He is well-developed. He is diaphoretic.  HENT:     Head: Normocephalic and atraumatic.  Eyes:     Conjunctiva/sclera: Conjunctivae normal.  Cardiovascular:     Rate and Rhythm: Normal rate and regular rhythm.     Heart sounds: Normal heart sounds. No murmur heard.  No friction rub.  Pulmonary:     Effort: Pulmonary effort is normal. No respiratory distress.     Breath sounds: Normal breath sounds. No wheezing or rales.  Abdominal:     General: There is no distension.     Palpations: Abdomen is soft.     Tenderness: There is no abdominal tenderness.  Musculoskeletal:     Cervical back: Neck supple.  Skin:    General: Skin is warm.     Capillary Refill: Capillary refill takes less than 2 seconds.  Neurological:     Mental Status: He is alert and oriented to person, place, and time.     Motor: No abnormal muscle tone.       ____________________________________________   LABS (all labs ordered are listed, but only abnormal results are displayed)  Labs Reviewed  CBC WITH DIFFERENTIAL/PLATELET - Abnormal; Notable for the following components:      Result Value   RBC 3.36 (*)    Hemoglobin 11.1 (*)    HCT 33.4 (*)    All other components within normal limits    COMPREHENSIVE METABOLIC PANEL - Abnormal; Notable for the following components:   Potassium 5.2 (*)    All other components within normal limits  SARS CORONAVIRUS 2 BY RT PCR (HOSPITAL ORDER, PERFORMED IN [facility])  LIPASE, BLOOD  BRAIN NATRIURETIC PEPTIDE  APTT  PROTIME-INR  HEPARIN LEVEL (UNFRACTIONATED)  CBC  TROPONIN I (HIGH SENSITIVITY)  TROPONIN I (HIGH SENSITIVITY)  TROPONIN I (HIGH SENSITIVITY)    ____________________________________________  EKG: Normal sinus rhythm, ventricular rate 97.  QRS 101, QTc 479.  Anteroseptal infarct, no  acute ST elevations or depressions. ________________________________________  RADIOLOGY All imaging, including plain films, CT scans, and ultrasounds, independently reviewed by me, and interpretations confirmed via formal radiology reads.  ED MD interpretation:   Chest x-ray: No acute abnormality  Official radiology report(s): DG Chest Portable 1 View  Result Date: 03/19/2020 CLINICAL DATA:  Short of breath. Chest pain beginning 30 minutes ago. EXAM: PORTABLE CHEST 1 VIEW COMPARISON:  03/02/2020 FINDINGS: Cardiac silhouette is normal in size. No mediastinal or hilar masses or evidence of adenopathy. Lungs are hyperexpanded, but clear. No pleural effusion or pneumothorax. Skeletal structures are grossly intact. IMPRESSION: No acute cardiopulmonary disease. Electronically Signed   By: Amie Portland M.D.   On: 03/19/2020 11:59    ____________________________________________  PROCEDURES   Procedure(s) performed (including Critical Care):  .Critical Care Performed by: Shaune Pollack, MD Authorized by: Shaune Pollack, MD   Critical care provider statement:    Critical care time (minutes):  35   Critical care time was exclusive of:  Separately billable procedures and treating other patients and teaching time   Critical care was necessary to treat or prevent imminent or life-threatening deterioration of the following  conditions:  Circulatory failure, cardiac failure and respiratory failure   Critical care was time spent personally by me on the following activities:  Development of treatment plan with patient or surrogate, discussions with consultants, evaluation of patient's response to treatment, examination of patient, obtaining history from patient or surrogate, ordering and performing treatments and interventions, ordering and review of laboratory studies, ordering and review of radiographic studies, pulse oximetry, re-evaluation of patient's condition and review of old charts   I assumed direction of critical care for this patient from another provider in my specialty: no   .1-3 Lead EKG Interpretation Performed by: Shaune Pollack, MD Authorized by: Shaune Pollack, MD     Interpretation: normal     ECG rate:  80-90   ECG rate assessment: normal     Rhythm: sinus rhythm     Ectopy: none     Conduction: normal   Comments:     Indication: Chest pain    ____________________________________________  INITIAL IMPRESSION / MDM / ASSESSMENT AND PLAN / ED COURSE  As part of my medical decision making, I reviewed the following data within the electronic MEDICAL RECORD NUMBER Nursing notes reviewed and incorporated, Old chart reviewed, Notes from prior ED visits, and [place] Controlled Substance Database       *Bhargav Barbaro Baroni was evaluated in Emergency Department on 03/19/2020 for the symptoms described in the history of present illness. He was evaluated in the context of the global COVID-19 pandemic, which necessitated consideration that the patient might be at risk for infection with the SARS-CoV-2 virus that causes COVID-19. Institutional protocols and algorithms that pertain to the evaluation of patients at risk for COVID-19 are in a state of rapid change based on information released by regulatory bodies including the CDC and federal and state organizations. These policies and algorithms were followed during  the patient's care in the ED.  Some ED evaluations and interventions may be delayed as a result of limited staffing during the pandemic.*     Medical Decision Making:  59 yo M with PMHx as above here with chest pain. Pt initially activated as CODE STEMI for EKG changes, which are somewhat dynamic compared to prior though resolved on arrival. Dr. Darrold Junker with Cardiology at bedside, does not feel this is STEMI but recommends admission, heparin, for likely unstable angina. Pt  has been given ASA and took his plavix today. Otherwise, pain is not c/f dissection. No signs of PE. Initial trop neg. Will admit to hospitalist.  While awaiting admission, pt requesting increasing doses of dilaudid. He has a known h/o chronic pain and there could be a component of opiate seeking behavior. Hospitalist made aware.  ____________________________________________  FINAL CLINICAL IMPRESSION(S) / ED DIAGNOSES  Final diagnoses:  None     MEDICATIONS GIVEN DURING THIS VISIT:  Medications  nitroGLYCERIN (NITROSTAT) SL tablet 0.4 mg (0.4 mg Sublingual Given 03/19/20 1211)  heparin ADULT infusion 100 units/mL (25000 units/222mL sodium chloride 0.45%) (0 Units/hr Intravenous Stopped 03/19/20 1459)  fentaNYL (SUBLIMAZE) injection 25 mcg (25 mcg Intravenous Given 03/19/20 1428)  ondansetron (ZOFRAN) injection 4 mg (has no administration in time range)  hydrALAZINE (APRESOLINE) injection 5 mg (has no administration in time range)  acetaminophen (TYLENOL) tablet 650 mg (has no administration in time range)  0.9 %  sodium chloride infusion (has no administration in time range)  atorvastatin (LIPITOR) tablet 40 mg (has no administration in time range)  0.9 %  sodium chloride infusion ( Intravenous Stopped 03/19/20 1605)  fentaNYL (SUBLIMAZE) injection 50 mcg (50 mcg Intravenous Given 03/19/20 1149)  ondansetron (ZOFRAN) injection 4 mg (4 mg Intravenous Given 03/19/20 1147)  HYDROmorphone (DILAUDID) injection 1 mg (1 mg  Intravenous Given 03/19/20 1221)  heparin bolus via infusion 3,800 Units (3,800 Units Intravenous Bolus from Bag 03/19/20 1233)  HYDROmorphone (DILAUDID) injection 1 mg (1 mg Intravenous Given 03/19/20 1335)     ED Discharge Orders    None       Note:  This document was prepared using Dragon voice recognition software and may include unintentional dictation errors.   Shaune Pollack, MD 03/19/20 [phone]

## 2020-05-16 DIAGNOSIS — I10 Essential (primary) hypertension: Secondary | ICD-10-CM | POA: Diagnosis not present

## 2020-05-16 DIAGNOSIS — I2510 Atherosclerotic heart disease of native coronary artery without angina pectoris: Secondary | ICD-10-CM | POA: Diagnosis not present

## 2020-05-16 DIAGNOSIS — I200 Unstable angina: Secondary | ICD-10-CM | POA: Diagnosis not present

## 2021-10-19 IMAGING — CR DG CHEST 2V
2 series · 2 of 2 positions shown · non-contrast
Comparison: November 03, 2018

CLINICAL DATA: Chest pain

EXAM:
CHEST - 2 VIEW

[chest pa]
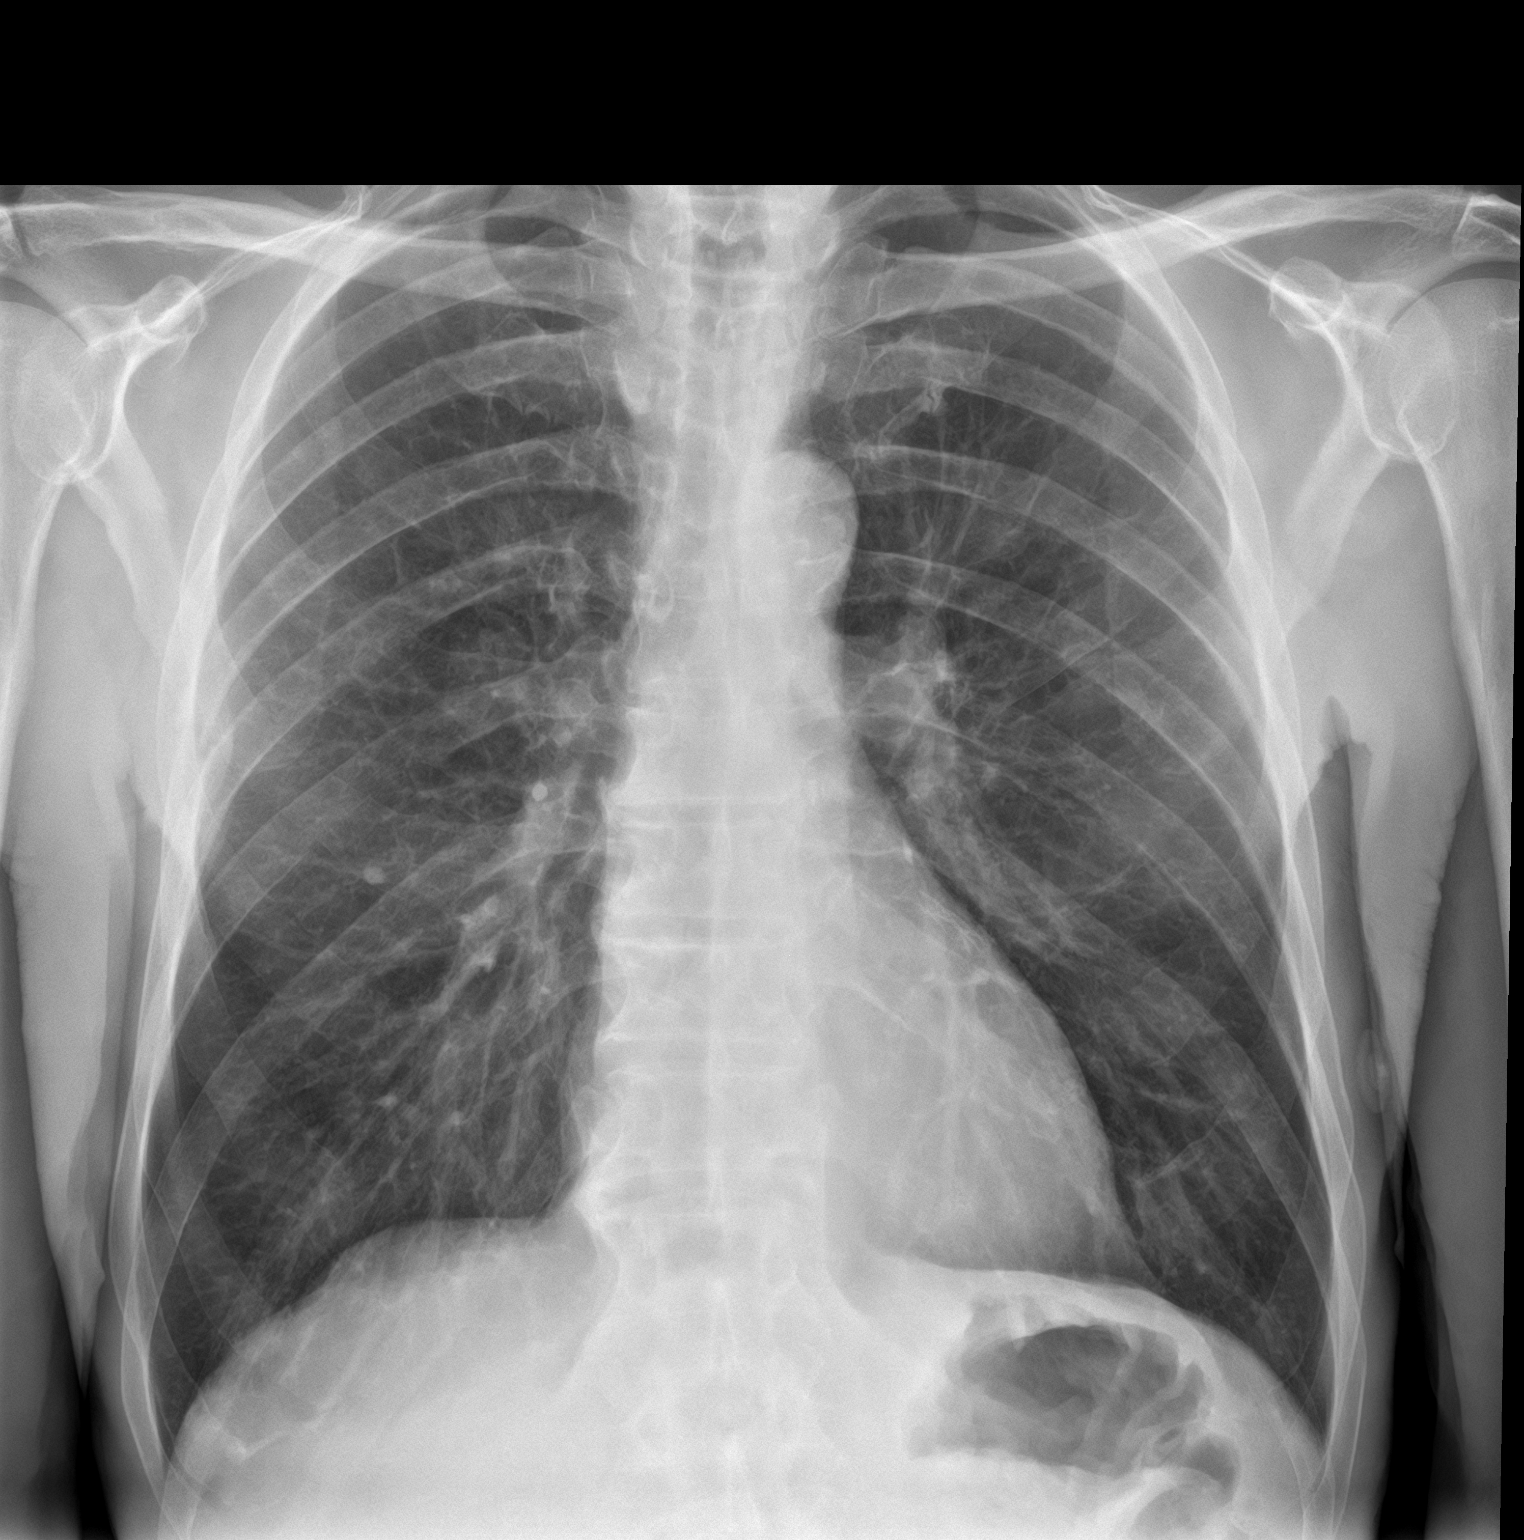

[chest lat]
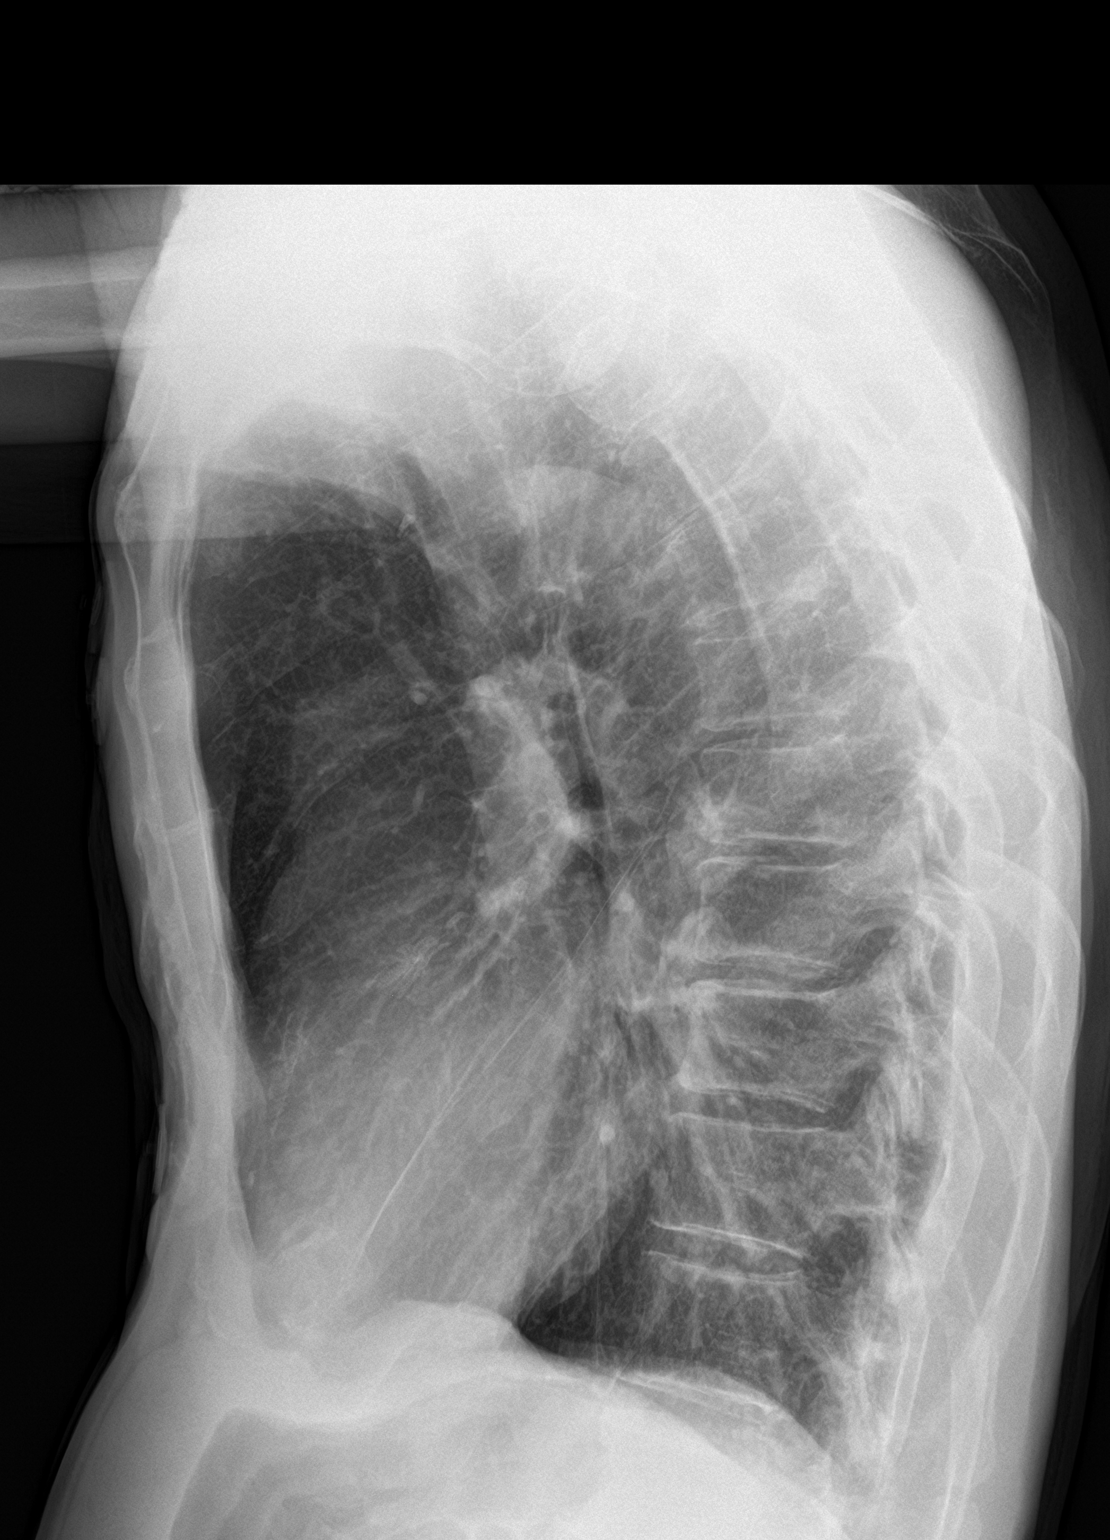

[2 of 2 positions shown; findings below may reference images not displayed]

FINDINGS: The heart size and mediastinal contours are within normal limits.
Both lungs are clear. The visualized skeletal structures are
unremarkable. Calcified granulomas are noted. Atherosclerotic
changes are noted of the thoracic aorta.
IMPRESSION: No active cardiopulmonary disease.

## 2021-11-05 IMAGING — DX DG CHEST 1V PORT
2 series · 2 of 2 positions shown · non-contrast
Comparison: 03/02/2020

CLINICAL DATA: Short of breath. Chest pain beginning 30 minutes
ago.

EXAM:
PORTABLE CHEST 1 VIEW

[chest ap (1 of 2)]
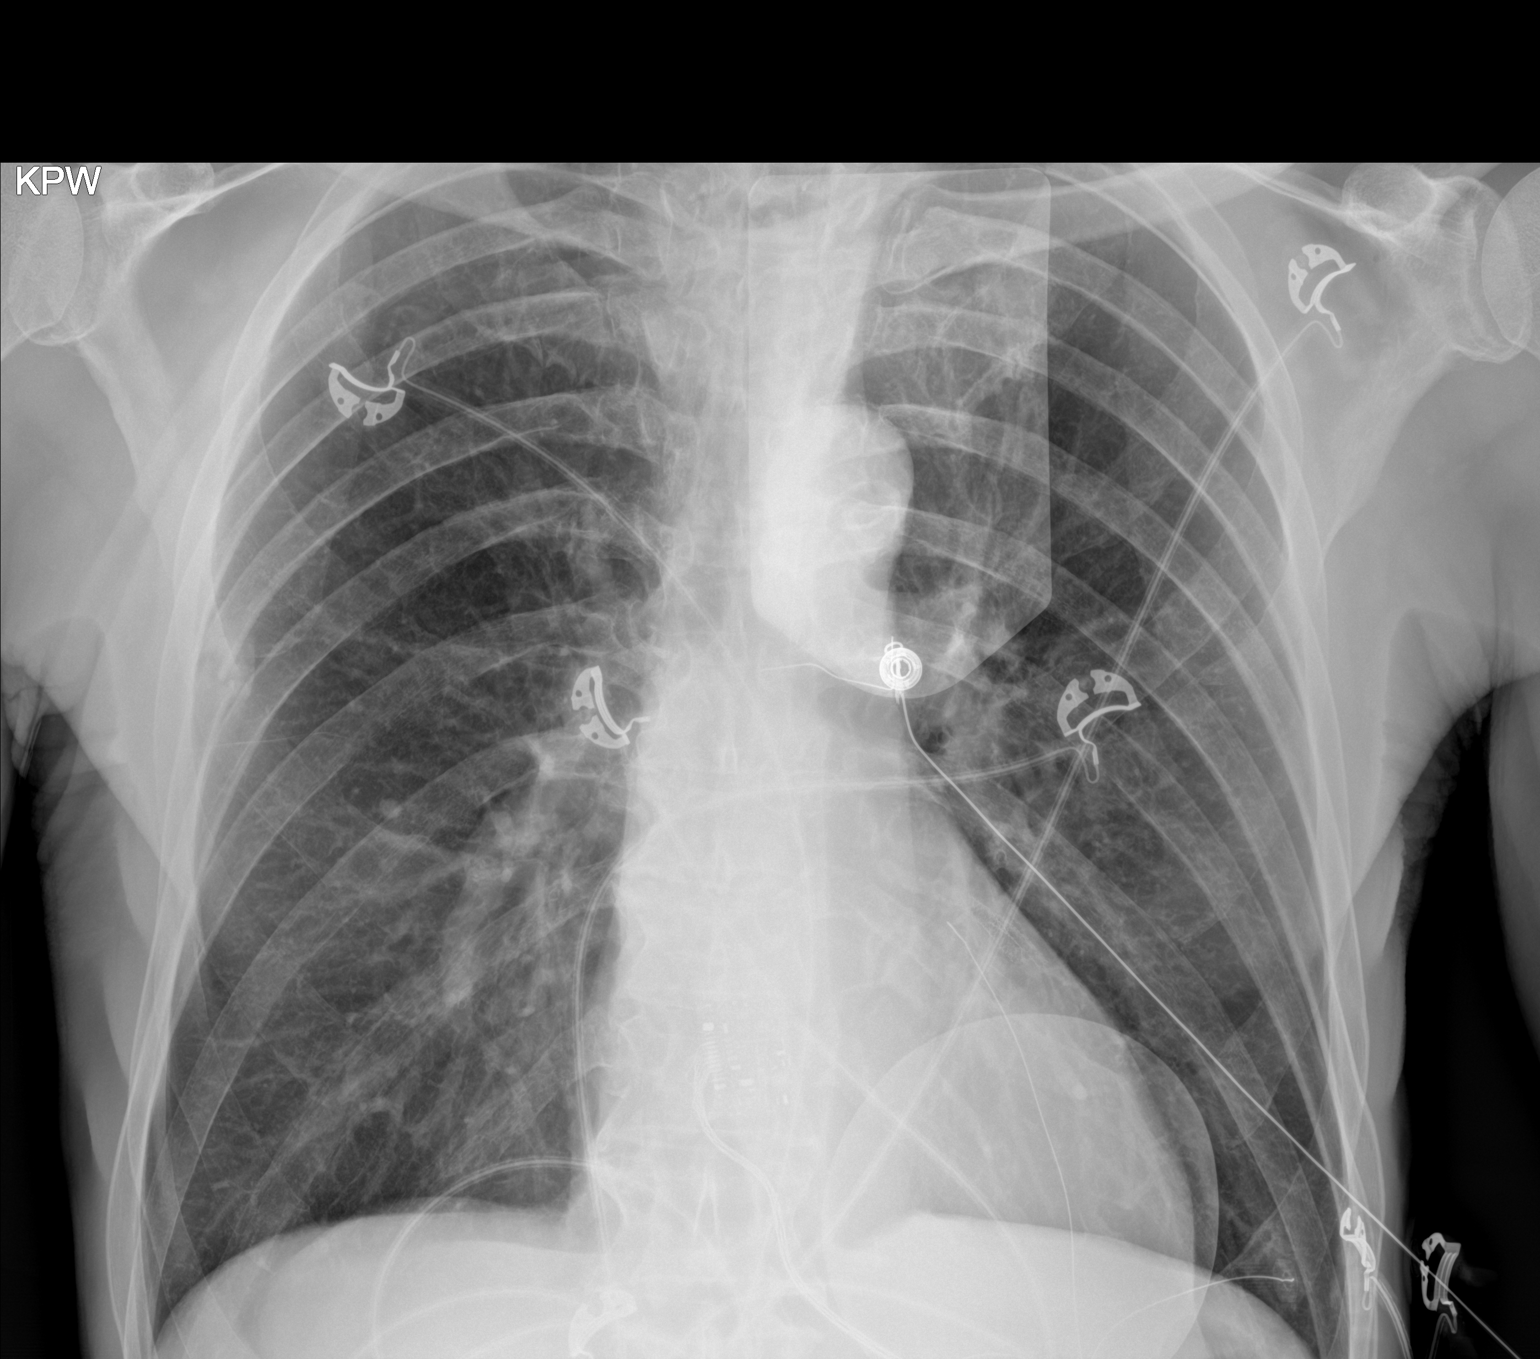

[chest ap (2 of 2)]
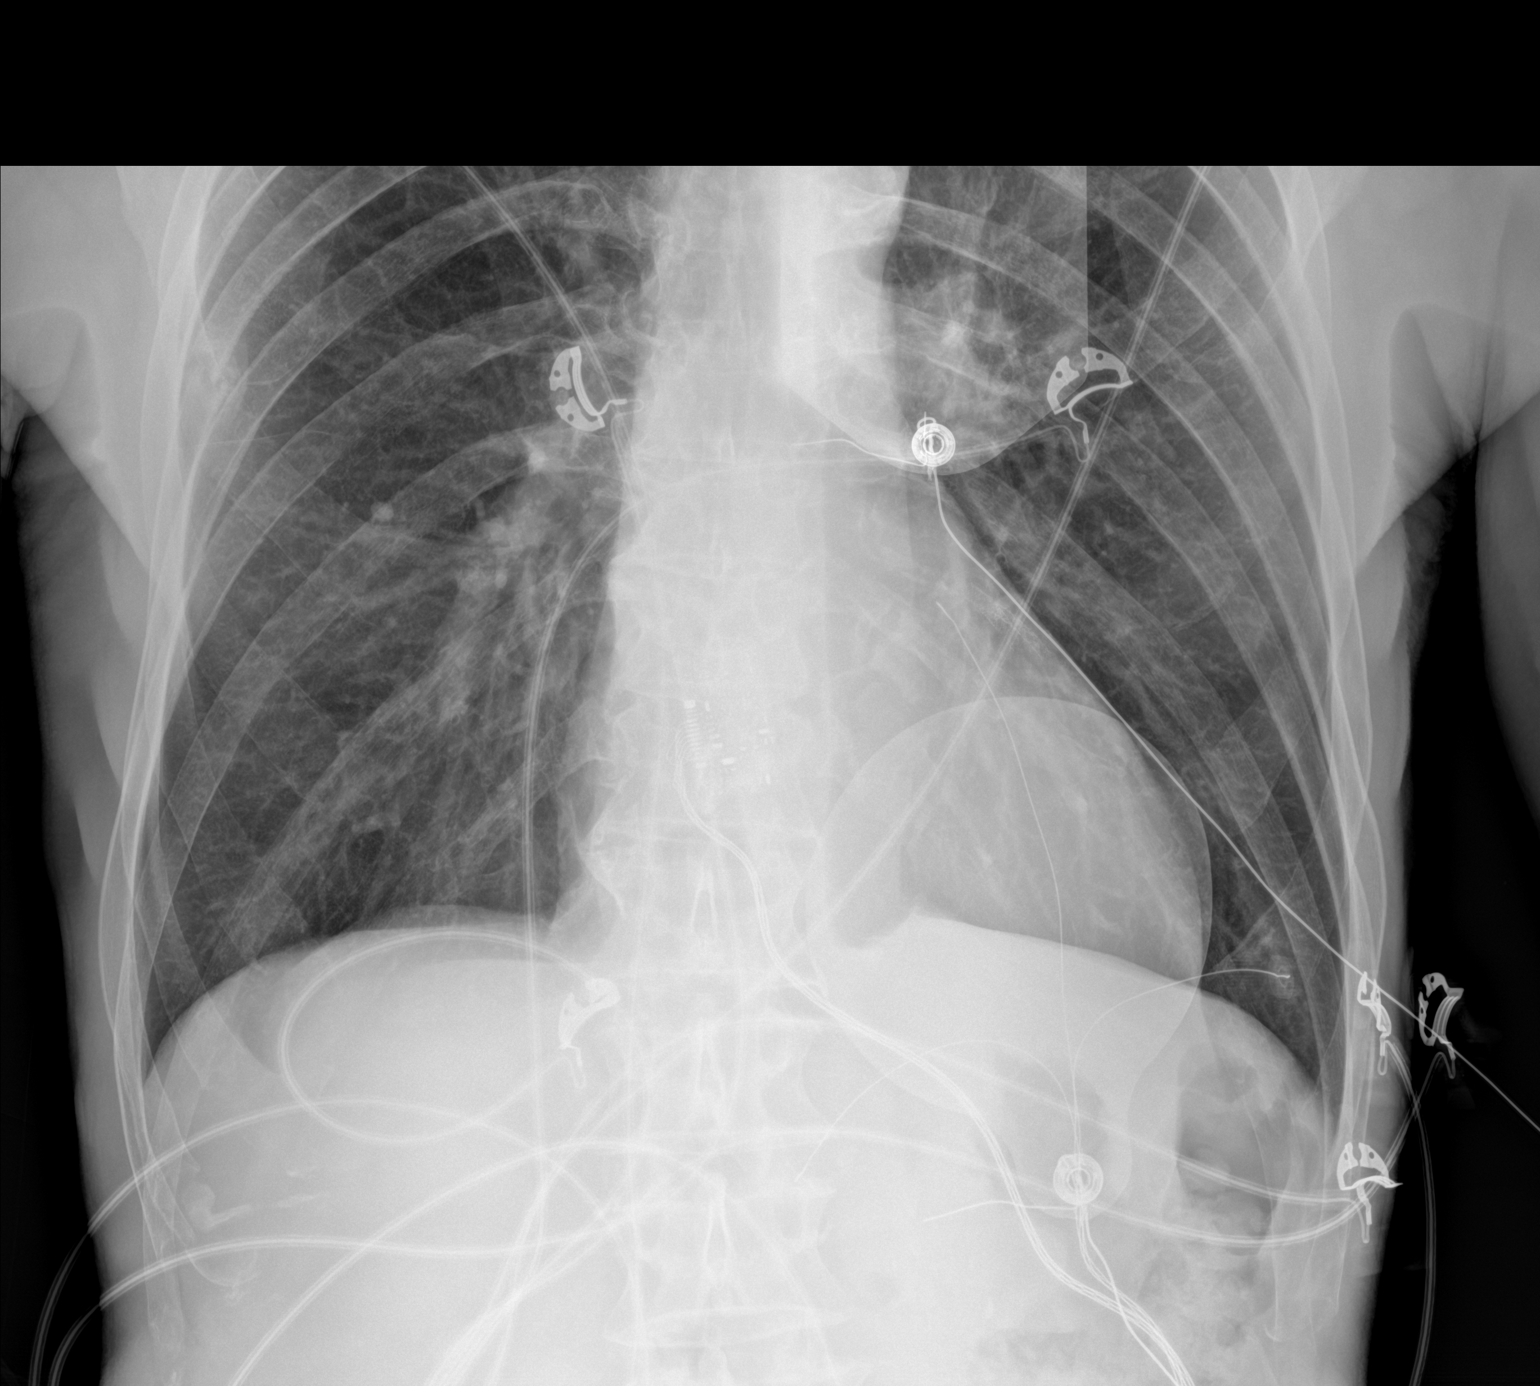

[2 of 2 positions shown; findings below may reference images not displayed]

FINDINGS: Cardiac silhouette is normal in size. No mediastinal or hilar masses
or evidence of adenopathy.

Lungs are hyperexpanded, but clear.

No pleural effusion or pneumothorax.

Skeletal structures are grossly intact.
IMPRESSION: No acute cardiopulmonary disease.
